# Patient Record
Sex: Female | Born: 1980 | Race: Black or African American | Hispanic: No | Marital: Single | Smoking: Current every day smoker
Health system: Southern US, Community
[De-identification: ages and names within clinical notes are randomized; demographics above are authoritative.]

## PROBLEM LIST (undated history)

## (undated) DIAGNOSIS — Z972 Presence of dental prosthetic device (complete) (partial): Secondary | ICD-10-CM

## (undated) DIAGNOSIS — D649 Anemia, unspecified: Secondary | ICD-10-CM

## (undated) DIAGNOSIS — I10 Essential (primary) hypertension: Secondary | ICD-10-CM

## (undated) DIAGNOSIS — A6000 Herpesviral infection of urogenital system, unspecified: Secondary | ICD-10-CM

## (undated) DIAGNOSIS — R87619 Unspecified abnormal cytological findings in specimens from cervix uteri: Secondary | ICD-10-CM

## (undated) DIAGNOSIS — Z973 Presence of spectacles and contact lenses: Secondary | ICD-10-CM

## (undated) DIAGNOSIS — M5030 Other cervical disc degeneration, unspecified cervical region: Secondary | ICD-10-CM

## (undated) DIAGNOSIS — N9985 Post endometrial ablation syndrome: Secondary | ICD-10-CM

## (undated) DIAGNOSIS — F411 Generalized anxiety disorder: Secondary | ICD-10-CM

## (undated) DIAGNOSIS — M4722 Other spondylosis with radiculopathy, cervical region: Secondary | ICD-10-CM

## (undated) DIAGNOSIS — E049 Nontoxic goiter, unspecified: Secondary | ICD-10-CM

## (undated) DIAGNOSIS — Z8616 Personal history of COVID-19: Secondary | ICD-10-CM

## (undated) DIAGNOSIS — F1911 Other psychoactive substance abuse, in remission: Secondary | ICD-10-CM

## (undated) DIAGNOSIS — M4727 Other spondylosis with radiculopathy, lumbosacral region: Secondary | ICD-10-CM

## (undated) HISTORY — PX: WISDOM TOOTH EXTRACTION: SHX21

## (undated) HISTORY — PX: FOOT SURGERY: SHX648

---

## 1998-03-06 ENCOUNTER — Inpatient Hospital Stay (HOSPITAL_COMMUNITY): Admission: AD | Admit: 1998-03-06 | Discharge: 1998-03-06 | Payer: Self-pay | Admitting: Obstetrics & Gynecology

## 1998-03-06 ENCOUNTER — Emergency Department (HOSPITAL_COMMUNITY): Admission: EM | Admit: 1998-03-06 | Discharge: 1998-03-06 | Payer: Self-pay | Admitting: Emergency Medicine

## 1998-03-06 ENCOUNTER — Encounter: Payer: Self-pay | Admitting: Emergency Medicine

## 1998-03-07 ENCOUNTER — Inpatient Hospital Stay (HOSPITAL_COMMUNITY): Admission: AD | Admit: 1998-03-07 | Discharge: 1998-03-07 | Payer: Self-pay | Admitting: *Deleted

## 1998-10-07 ENCOUNTER — Inpatient Hospital Stay (HOSPITAL_COMMUNITY): Admission: AD | Admit: 1998-10-07 | Discharge: 1998-10-07 | Payer: Self-pay | Admitting: *Deleted

## 1998-10-08 ENCOUNTER — Inpatient Hospital Stay (HOSPITAL_COMMUNITY): Admission: AD | Admit: 1998-10-08 | Discharge: 1998-10-08 | Payer: Self-pay | Admitting: Obstetrics

## 1999-06-05 ENCOUNTER — Emergency Department (HOSPITAL_COMMUNITY): Admission: EM | Admit: 1999-06-05 | Discharge: 1999-06-05 | Payer: Self-pay | Admitting: Emergency Medicine

## 1999-09-16 ENCOUNTER — Inpatient Hospital Stay (HOSPITAL_COMMUNITY): Admission: EM | Admit: 1999-09-16 | Discharge: 1999-09-16 | Payer: Self-pay | Admitting: Obstetrics

## 1999-10-06 ENCOUNTER — Emergency Department (HOSPITAL_COMMUNITY): Admission: EM | Admit: 1999-10-06 | Discharge: 1999-10-06 | Payer: Self-pay | Admitting: Emergency Medicine

## 1999-10-06 ENCOUNTER — Encounter: Payer: Self-pay | Admitting: Emergency Medicine

## 1999-11-30 ENCOUNTER — Emergency Department (HOSPITAL_COMMUNITY): Admission: EM | Admit: 1999-11-30 | Discharge: 1999-11-30 | Payer: Self-pay | Admitting: Emergency Medicine

## 1999-11-30 ENCOUNTER — Encounter: Payer: Self-pay | Admitting: Emergency Medicine

## 2000-02-12 ENCOUNTER — Emergency Department (HOSPITAL_COMMUNITY): Admission: EM | Admit: 2000-02-12 | Discharge: 2000-02-12 | Payer: Self-pay | Admitting: Emergency Medicine

## 2000-10-12 ENCOUNTER — Encounter: Payer: Self-pay | Admitting: Emergency Medicine

## 2000-10-12 ENCOUNTER — Emergency Department (HOSPITAL_COMMUNITY): Admission: EM | Admit: 2000-10-12 | Discharge: 2000-10-13 | Payer: Self-pay | Admitting: Internal Medicine

## 2001-11-09 ENCOUNTER — Emergency Department (HOSPITAL_COMMUNITY): Admission: EM | Admit: 2001-11-09 | Discharge: 2001-11-09 | Payer: Self-pay | Admitting: Emergency Medicine

## 2003-07-22 ENCOUNTER — Emergency Department (HOSPITAL_COMMUNITY): Admission: AD | Admit: 2003-07-22 | Discharge: 2003-07-22 | Payer: Self-pay | Admitting: Family Medicine

## 2003-08-10 ENCOUNTER — Emergency Department (HOSPITAL_COMMUNITY): Admission: AD | Admit: 2003-08-10 | Discharge: 2003-08-10 | Payer: Self-pay | Admitting: Family Medicine

## 2004-01-17 ENCOUNTER — Emergency Department (HOSPITAL_COMMUNITY): Admission: EM | Admit: 2004-01-17 | Discharge: 2004-01-17 | Payer: Self-pay | Admitting: Family Medicine

## 2004-02-28 ENCOUNTER — Emergency Department (HOSPITAL_COMMUNITY): Admission: EM | Admit: 2004-02-28 | Discharge: 2004-02-28 | Payer: Self-pay | Admitting: Emergency Medicine

## 2004-04-06 ENCOUNTER — Emergency Department (HOSPITAL_COMMUNITY): Admission: EM | Admit: 2004-04-06 | Discharge: 2004-04-06 | Payer: Self-pay | Admitting: Emergency Medicine

## 2004-07-24 ENCOUNTER — Emergency Department (HOSPITAL_COMMUNITY): Admission: EM | Admit: 2004-07-24 | Discharge: 2004-07-24 | Payer: Self-pay | Admitting: Family Medicine

## 2004-11-12 ENCOUNTER — Emergency Department (HOSPITAL_COMMUNITY): Admission: EM | Admit: 2004-11-12 | Discharge: 2004-11-13 | Payer: Self-pay | Admitting: Emergency Medicine

## 2004-11-15 ENCOUNTER — Emergency Department (HOSPITAL_COMMUNITY): Admission: EM | Admit: 2004-11-15 | Discharge: 2004-11-15 | Payer: Self-pay | Admitting: *Deleted

## 2004-11-19 ENCOUNTER — Emergency Department (HOSPITAL_COMMUNITY): Admission: EM | Admit: 2004-11-19 | Discharge: 2004-11-19 | Payer: Self-pay | Admitting: Family Medicine

## 2005-03-27 ENCOUNTER — Emergency Department (HOSPITAL_COMMUNITY): Admission: EM | Admit: 2005-03-27 | Discharge: 2005-03-27 | Payer: Self-pay | Admitting: Emergency Medicine

## 2005-07-06 ENCOUNTER — Emergency Department (HOSPITAL_COMMUNITY): Admission: EM | Admit: 2005-07-06 | Discharge: 2005-07-06 | Payer: Self-pay | Admitting: Emergency Medicine

## 2006-04-20 ENCOUNTER — Emergency Department (HOSPITAL_COMMUNITY): Admission: EM | Admit: 2006-04-20 | Discharge: 2006-04-20 | Payer: Self-pay | Admitting: Emergency Medicine

## 2006-05-07 ENCOUNTER — Emergency Department (HOSPITAL_COMMUNITY): Admission: EM | Admit: 2006-05-07 | Discharge: 2006-05-07 | Payer: Self-pay | Admitting: Emergency Medicine

## 2006-10-21 ENCOUNTER — Emergency Department (HOSPITAL_COMMUNITY): Admission: EM | Admit: 2006-10-21 | Discharge: 2006-10-22 | Payer: Self-pay | Admitting: Emergency Medicine

## 2006-10-23 ENCOUNTER — Emergency Department (HOSPITAL_COMMUNITY): Admission: EM | Admit: 2006-10-23 | Discharge: 2006-10-23 | Payer: Self-pay | Admitting: Family Medicine

## 2007-09-15 ENCOUNTER — Inpatient Hospital Stay (HOSPITAL_COMMUNITY): Admission: AD | Admit: 2007-09-15 | Discharge: 2007-09-15 | Payer: Self-pay | Admitting: Obstetrics and Gynecology

## 2007-09-21 ENCOUNTER — Encounter: Admission: RE | Admit: 2007-09-21 | Discharge: 2007-09-21 | Payer: Self-pay | Admitting: Obstetrics and Gynecology

## 2007-10-12 ENCOUNTER — Inpatient Hospital Stay (HOSPITAL_COMMUNITY): Admission: AD | Admit: 2007-10-12 | Discharge: 2007-10-12 | Payer: Self-pay | Admitting: Obstetrics & Gynecology

## 2007-11-11 ENCOUNTER — Emergency Department (HOSPITAL_COMMUNITY): Admission: EM | Admit: 2007-11-11 | Discharge: 2007-11-11 | Payer: Self-pay | Admitting: Family Medicine

## 2008-01-26 ENCOUNTER — Ambulatory Visit (HOSPITAL_COMMUNITY): Admission: RE | Admit: 2008-01-26 | Discharge: 2008-01-26 | Payer: Self-pay | Admitting: Family Medicine

## 2008-04-08 ENCOUNTER — Ambulatory Visit: Payer: Self-pay | Admitting: Obstetrics and Gynecology

## 2008-04-08 ENCOUNTER — Inpatient Hospital Stay (HOSPITAL_COMMUNITY): Admission: AD | Admit: 2008-04-08 | Discharge: 2008-04-11 | Payer: Self-pay | Admitting: Obstetrics & Gynecology

## 2008-04-09 ENCOUNTER — Encounter (HOSPITAL_COMMUNITY): Payer: Self-pay | Admitting: Obstetrics and Gynecology

## 2008-06-20 ENCOUNTER — Emergency Department (HOSPITAL_COMMUNITY): Admission: EM | Admit: 2008-06-20 | Discharge: 2008-06-20 | Payer: Self-pay | Admitting: Family Medicine

## 2009-04-27 ENCOUNTER — Ambulatory Visit: Payer: Self-pay | Admitting: Obstetrics and Gynecology

## 2009-04-27 ENCOUNTER — Inpatient Hospital Stay (HOSPITAL_COMMUNITY): Admission: AD | Admit: 2009-04-27 | Discharge: 2009-04-27 | Payer: Self-pay | Admitting: Obstetrics and Gynecology

## 2009-08-17 ENCOUNTER — Ambulatory Visit: Payer: Self-pay | Admitting: Advanced Practice Midwife

## 2010-04-09 ENCOUNTER — Inpatient Hospital Stay (HOSPITAL_COMMUNITY): Admission: AD | Admit: 2010-04-09 | Discharge: 2009-08-19 | Payer: Self-pay | Admitting: Obstetrics & Gynecology

## 2010-07-21 LAB — RAPID URINE DRUG SCREEN, HOSP PERFORMED: Tetrahydrocannabinol: NOT DETECTED

## 2010-07-21 LAB — CBC
HCT: 34.3 % — ABNORMAL LOW (ref 36.0–46.0)
MCHC: 33.8 g/dL (ref 30.0–36.0)
MCV: 96.7 fL (ref 78.0–100.0)
Platelets: 257 10*3/uL (ref 150–400)
RDW: 12.8 % (ref 11.5–15.5)

## 2010-08-03 LAB — WET PREP, GENITAL
Trich, Wet Prep: NONE SEEN
Yeast Wet Prep HPF POC: NONE SEEN

## 2010-08-03 LAB — DIFFERENTIAL
Basophils Absolute: 0 10*3/uL (ref 0.0–0.1)
Basophils Relative: 0 % (ref 0–1)
Eosinophils Relative: 1 % (ref 0–5)
Monocytes Absolute: 0.8 10*3/uL (ref 0.1–1.0)
Neutro Abs: 6.2 10*3/uL (ref 1.7–7.7)

## 2010-08-03 LAB — URINALYSIS, ROUTINE W REFLEX MICROSCOPIC
Bilirubin Urine: NEGATIVE
Nitrite: NEGATIVE
Specific Gravity, Urine: 1.015 (ref 1.005–1.030)
Urobilinogen, UA: 0.2 mg/dL (ref 0.0–1.0)

## 2010-08-03 LAB — COMPREHENSIVE METABOLIC PANEL
Alkaline Phosphatase: 63 U/L (ref 39–117)
BUN: 5 mg/dL — ABNORMAL LOW (ref 6–23)
CO2: 26 mEq/L (ref 19–32)
Chloride: 103 mEq/L (ref 96–112)
Glucose, Bld: 82 mg/dL (ref 70–99)
Potassium: 3.5 mEq/L (ref 3.5–5.1)
Total Bilirubin: 0.3 mg/dL (ref 0.3–1.2)

## 2010-08-03 LAB — FETAL FIBRONECTIN: Fetal Fibronectin: POSITIVE — AB

## 2010-08-03 LAB — CBC
HCT: 33.6 % — ABNORMAL LOW (ref 36.0–46.0)
Hemoglobin: 11.5 g/dL — ABNORMAL LOW (ref 12.0–15.0)
MCHC: 34.2 g/dL (ref 30.0–36.0)
RDW: 12.7 % (ref 11.5–15.5)

## 2010-08-03 LAB — RAPID URINE DRUG SCREEN, HOSP PERFORMED: Cocaine: POSITIVE — AB

## 2011-01-27 LAB — HCG, QUANTITATIVE, PREGNANCY

## 2011-01-27 LAB — GC/CHLAMYDIA PROBE AMP, GENITAL
Chlamydia, DNA Probe: NEGATIVE
GC Probe Amp, Genital: NEGATIVE

## 2011-01-28 LAB — URINALYSIS, ROUTINE W REFLEX MICROSCOPIC
Glucose, UA: NEGATIVE
Leukocytes, UA: NEGATIVE
Nitrite: POSITIVE — AB
Protein, ur: NEGATIVE
pH: 6

## 2011-01-28 LAB — URINE MICROSCOPIC-ADD ON

## 2011-01-28 LAB — WET PREP, GENITAL

## 2011-02-04 LAB — CBC
HCT: 34.1 % — ABNORMAL LOW (ref 36.0–46.0)
Hemoglobin: 11.6 g/dL — ABNORMAL LOW (ref 12.0–15.0)
MCHC: 34.1 g/dL (ref 30.0–36.0)
Platelets: 239 10*3/uL (ref 150–400)
RDW: 13.8 % (ref 11.5–15.5)

## 2011-02-04 LAB — RAPID URINE DRUG SCREEN, HOSP PERFORMED
Amphetamines: NOT DETECTED
Opiates: NOT DETECTED
Tetrahydrocannabinol: NOT DETECTED

## 2011-02-17 LAB — CULTURE, ROUTINE-ABSCESS

## 2011-09-22 ENCOUNTER — Emergency Department: Payer: Self-pay | Admitting: Emergency Medicine

## 2013-04-10 ENCOUNTER — Emergency Department (INDEPENDENT_AMBULATORY_CARE_PROVIDER_SITE_OTHER)
Admission: EM | Admit: 2013-04-10 | Discharge: 2013-04-10 | Disposition: A | Discharge status: Home / Self Care | Payer: BC Managed Care – PPO | Source: Home / Self Care

## 2013-04-10 ENCOUNTER — Encounter (HOSPITAL_COMMUNITY): Payer: Self-pay | Admitting: Emergency Medicine

## 2013-04-10 DIAGNOSIS — J329 Chronic sinusitis, unspecified: Secondary | ICD-10-CM

## 2013-04-10 MED ORDER — AMOXICILLIN-POT CLAVULANATE 875-125 MG PO TABS: 1.0000 | ORAL_TABLET | Freq: Two times a day (BID) | ORAL | Status: DC

## 2013-04-10 NOTE — ED Notes (Signed)
Pt  Reports  Some sinus pressure with  Pain  Around  Eyes       -  She  Has  Some swelling   To r  Periorbital  Area            -  She       Reports  Symptoms  Not releived  By  OTC  meds             She  Reports   The    Light  hurts  Her  Eyes     And  She  Has  Pain  When  She looks  Down

## 2013-04-10 NOTE — ED Provider Notes (Signed)
Alison Hamilton is a 32 y.o. female who presents to Urgent Care today for right facial pain and pressure. This is been present for one day. This is associated with nasal congestion. She denies any nausea vomiting diarrhea fevers or chills. She denies any trouble breathing. She additionally has some left-sided neck pain. She denies any injury, or radiating pain weakness or numbness.   History reviewed. No pertinent past medical history. History  Substance Use Topics  . Smoking status: Current Every Day Smoker  . Smokeless tobacco: Not on file  . Alcohol Use: No   ROS as above Medications reviewed. No current facility-administered medications for this encounter.   Current Outpatient Prescriptions  Medication Sig Dispense Refill  . amoxicillin-clavulanate (AUGMENTIN) 875-125 MG per tablet Take 1 tablet by mouth every 12 (twelve) hours.  14 tablet  0    Exam:  BP 115/72  Pulse 83  Temp(Src) 98.2 F (36.8 C) (Oral)  Resp 16  SpO2 100% Gen: Well NAD HEENT: EOMI,  MMM, tympanic membranes are normal appearing bilaterally. Posterior pharynx is mildly erythematous. Right maxillary sinuse tender to palpation. Lungs: Normal work of breathing. CTABL Heart: RRR no MRG Abd: NABS, Soft. NT, ND Neck: Nontender to spinal midline normal neck range of motion   Assessment and Plan: 32 y.o. female with sinusitis. Likely bacterial. Plan to treat with Augmentin. Additionally use over-the-counter NSAIDs as needed for pain. Work note provided. Discussed warning signs or symptoms. Please see discharge instructions. Patient expresses understanding.      Rodolph Bong, MD 04/10/13 1501

## 2013-09-10 ENCOUNTER — Ambulatory Visit: Payer: BC Managed Care – PPO | Admitting: Family Medicine

## 2013-09-10 DIAGNOSIS — Z0289 Encounter for other administrative examinations: Secondary | ICD-10-CM

## 2014-05-28 ENCOUNTER — Encounter (HOSPITAL_COMMUNITY): Payer: Self-pay | Admitting: Emergency Medicine

## 2014-05-28 ENCOUNTER — Emergency Department (HOSPITAL_COMMUNITY)
Admission: EM | Admit: 2014-05-28 | Discharge: 2014-05-28 | Disposition: A | Discharge status: Home / Self Care | Payer: Self-pay | Attending: Emergency Medicine | Admitting: Emergency Medicine

## 2014-05-28 DIAGNOSIS — L02412 Cutaneous abscess of left axilla: Secondary | ICD-10-CM | POA: Insufficient documentation

## 2014-05-28 DIAGNOSIS — Z792 Long term (current) use of antibiotics: Secondary | ICD-10-CM | POA: Insufficient documentation

## 2014-05-28 DIAGNOSIS — Z720 Tobacco use: Secondary | ICD-10-CM | POA: Insufficient documentation

## 2014-05-28 MED ORDER — LIDOCAINE-EPINEPHRINE (PF) 2 %-1:200000 IJ SOLN
10.0000 mL | Freq: Once | INTRAMUSCULAR | Status: AC
  Administered 2014-05-28: 10 mL
  Filled 2014-05-28: qty 20

## 2014-05-28 NOTE — ED Notes (Signed)
MD at bedside to drain abscess.

## 2014-05-28 NOTE — Discharge Instructions (Signed)
Abscess °An abscess (boil or furuncle) is an infected area on or under the skin. This area is filled with yellowish-white fluid (pus) and other material (debris). °HOME CARE  °· Only take medicines as told by your doctor. °· If you were given antibiotic medicine, take it as directed. Finish the medicine even if you start to feel better. °· If gauze is used, follow your doctor's directions for changing the gauze. °· To avoid spreading the infection: °· Keep your abscess covered with a bandage. °· Wash your hands well. °· Do not share personal care items, towels, or whirlpools with others. °· Avoid skin contact with others. °· Keep your skin and clothes clean around the abscess. °· Keep all doctor visits as told. °GET HELP RIGHT AWAY IF:  °· You have more pain, puffiness (swelling), or redness in the wound site. °· You have more fluid or blood coming from the wound site. °· You have muscle aches, chills, or you feel sick. °· You have a fever. °MAKE SURE YOU:  °· Understand these instructions. °· Will watch your condition. °· Will get help right away if you are not doing well or get worse. °Document Released: 10/06/2007 Document Revised: 10/19/2011 Document Reviewed: 07/02/2011 °Exit[employer]® Patient Information ©2015 [employer]. This information is not intended to replace advice given to you by your health care provider. Make sure you discuss any questions you have with your health care provider. ° °Abscess °Care After °An abscess (also called a boil or furuncle) is an infected area that contains a collection of pus. Signs and symptoms of an abscess include pain, tenderness, redness, or hardness, or you may feel a moveable soft area under your skin. An abscess can occur anywhere in the body. The infection may spread to surrounding tissues causing cellulitis. A cut (incision) by the surgeon was made over your abscess and the pus was drained out. Gauze may have been packed into the space to provide a drain that will  allow the cavity to heal from the inside outwards. The boil may be painful for 5 to 7 days. Most people with a boil do not have high fevers. Your abscess, if seen early, may not have localized, and may not have been lanced. If not, another appointment may be required for this if it does not get better on its own or with medications. °HOME CARE INSTRUCTIONS  °· Only take over-the-counter or prescription medicines for pain, discomfort, or fever as directed by your caregiver. °· When you bathe, soak and then remove gauze or iodoform packs at least daily or as directed by your caregiver. You may then wash the wound gently with mild soapy water. Repack with gauze or do as your caregiver directs. °SEEK IMMEDIATE MEDICAL CARE IF:  °· You develop increased pain, swelling, redness, drainage, or bleeding in the wound site. °· You develop signs of generalized infection including muscle aches, chills, fever, or a general ill feeling. °· An oral temperature above 102° F (38.9° C) develops, not controlled by medication. °See your caregiver for a recheck if you develop any of the symptoms described above. If medications (antibiotics) were prescribed, take them as directed. °Document Released: 11/05/2004 Document Revised: 07/12/2011 Document Reviewed: 07/03/2007 °ExitCare® Patient Information ©2015 [employer]. This information is not intended to replace advice given to you by your health care provider. Make sure you discuss any questions you have with your health care provider. ° °

## 2014-05-28 NOTE — ED Provider Notes (Addendum)
CSN: [id number]     Arrival date & time 05/28/14  [id number] History   First MD Initiated Contact with Patient 05/28/14 [id number]     Chief Complaint  Patient presents with  . Abscess     (Consider location/radiation/quality/duration/timing/severity/associated sxs/prior Treatment) Patient is a 34 y.o. female presenting with abscess. The history is provided by the patient.  Abscess Location:  Shoulder/arm Shoulder/arm abscess location:  L axilla Size:  Golf ball sized Abscess quality: fluctuance, induration, painful and redness   Red streaking: no   Duration:  1 week Progression:  Worsening Pain details:    Quality:  Sharp, throbbing and shooting   Timing:  Constant   Progression:  Worsening Chronicity:  Recurrent Context: not diabetes, not immunosuppression and not injected drug use   Relieved by:  Nothing Worsened by:  Draining/squeezing Ineffective treatments:  Warm compresses Associated symptoms: no fever, no nausea and no vomiting   Risk factors: prior abscess     History reviewed. No pertinent past medical history. History reviewed. No pertinent past surgical history. History reviewed. No pertinent family history. History  Substance Use Topics  . Smoking status: Current Every Day Smoker  . Smokeless tobacco: Not on file  . Alcohol Use: No   OB History    No data available     Review of Systems  Constitutional: Negative for fever.  Gastrointestinal: Negative for nausea and vomiting.  All other systems reviewed and are negative.     Allergies  Review of patient's allergies indicates no known allergies.  Home Medications   Prior to Admission medications   Medication Sig Start Date End Date Taking? Authorizing Provider  amoxicillin-clavulanate (AUGMENTIN) 875-125 MG per tablet Take 1 tablet by mouth every 12 (twelve) hours. 04/10/13   Rodolph BongEvan S Corey, MD   BP 114/72 mmHg  Pulse 91  Temp(Src) 98.5 F (36.9 C) (Oral)  Resp 20  Ht 5\' 6"  (1.676 m)  Wt 156 lb (70.761  kg)  BMI 25.19 kg/m2  SpO2 98% Physical Exam  Constitutional: She is oriented to person, place, and time. She appears well-developed and well-nourished. She appears distressed.  HENT:  Head: Normocephalic and atraumatic.  Eyes: EOM are normal. Pupils are equal, round, and reactive to light.  Cardiovascular: Normal rate.   Pulmonary/Chest: Effort normal.  Musculoskeletal: Normal range of motion.  No edema  Neurological: She is alert and oriented to person, place, and time.  Skin: Skin is warm and dry. No rash noted.     Psychiatric: She has a normal mood and affect. Her behavior is normal.  Nursing note and vitals reviewed.   ED Course  Procedures (including critical care time) Labs Review Labs Reviewed - No data to display  Imaging Review No results found.   EKG Interpretation None     INCISION AND DRAINAGE Performed by: Gwyneth SproutPLUNKETT,Sundeep Cary Consent: Verbal consent obtained. Risks and benefits: risks, benefits and alternatives were discussed Type: abscess  Body area: left axilla  Anesthesia: local infiltration  Incision was made with a scalpel.  Local anesthetic: lidocaine 2% with epinephrine  Anesthetic total: 1 ml  Complexity: complex Blunt dissection to break up loculations  Drainage: purulent  Drainage amount: 5mL  Packing material: 1/4 in iodoform gauze  Patient tolerance: Patient tolerated the procedure well with no immediate complications.    MDM   Final diagnoses:  Abscess of left axilla    Patient with abscess in her left axilla. It is fluctuant, pointing and indurated. No drainage at this time. No surrounding erythema.  No systemic symptoms.    Gwyneth Sprout, MD 05/28/14 4098  Gwyneth Sprout, MD 05/28/14 [phone]

## 2014-05-28 NOTE — ED Notes (Signed)
Pt reports to ED with abscess. Pt reports it has been there 1 week, reports having chills. Pt in significant amount of pain. Abscess under left armpit. Pt denies medical hx.

## 2014-06-11 ENCOUNTER — Emergency Department (HOSPITAL_COMMUNITY)
Admission: EM | Admit: 2014-06-11 | Discharge: 2014-06-11 | Disposition: A | Discharge status: Home / Self Care | Payer: Self-pay | Attending: Emergency Medicine | Admitting: Emergency Medicine

## 2014-06-11 ENCOUNTER — Emergency Department (HOSPITAL_COMMUNITY): Payer: Self-pay

## 2014-06-11 ENCOUNTER — Encounter (HOSPITAL_COMMUNITY): Payer: Self-pay

## 2014-06-11 DIAGNOSIS — Z720 Tobacco use: Secondary | ICD-10-CM | POA: Insufficient documentation

## 2014-06-11 DIAGNOSIS — M25511 Pain in right shoulder: Secondary | ICD-10-CM | POA: Insufficient documentation

## 2014-06-11 DIAGNOSIS — R0789 Other chest pain: Secondary | ICD-10-CM | POA: Insufficient documentation

## 2014-06-11 DIAGNOSIS — M25519 Pain in unspecified shoulder: Secondary | ICD-10-CM

## 2014-06-11 LAB — BASIC METABOLIC PANEL
ANION GAP: 5 (ref 5–15)
BUN: 9 mg/dL (ref 6–23)
CHLORIDE: 108 mmol/L (ref 96–112)
CO2: 24 mmol/L (ref 19–32)
Calcium: 9.2 mg/dL (ref 8.4–10.5)
Creatinine, Ser: 0.87 mg/dL (ref 0.50–1.10)
GFR, EST NON AFRICAN AMERICAN: 85 mL/min — AB (ref >90)
Glucose, Bld: 98 mg/dL (ref 70–99)
POTASSIUM: 3.7 mmol/L (ref 3.5–5.1)
SODIUM: 137 mmol/L (ref 135–145)

## 2014-06-11 LAB — HEPATIC FUNCTION PANEL
ALK PHOS: 61 U/L (ref 39–117)
ALT: 12 U/L (ref 0–35)
AST: 15 U/L (ref 0–37)
Albumin: 4.1 g/dL (ref 3.5–5.2)
BILIRUBIN DIRECT: 0.2 mg/dL (ref 0.0–0.5)
BILIRUBIN TOTAL: 0.7 mg/dL (ref 0.3–1.2)
Indirect Bilirubin: 0.5 mg/dL (ref 0.3–0.9)
Total Protein: 7.1 g/dL (ref 6.0–8.3)

## 2014-06-11 LAB — I-STAT TROPONIN, ED: Troponin i, poc: 0 ng/mL (ref 0.00–0.08)

## 2014-06-11 LAB — CBC
HCT: 40.4 % (ref 36.0–46.0)
Hemoglobin: 14 g/dL (ref 12.0–15.0)
MCH: 31.1 pg (ref 26.0–34.0)
MCHC: 34.7 g/dL (ref 30.0–36.0)
MCV: 89.8 fL (ref 78.0–100.0)
PLATELETS: 237 10*3/uL (ref 150–400)
RBC: 4.5 MIL/uL (ref 3.87–5.11)
RDW: 12.7 % (ref 11.5–15.5)
WBC: 7 10*3/uL (ref 4.0–10.5)

## 2014-06-11 MED ORDER — MORPHINE SULFATE 4 MG/ML IJ SOLN
4.0000 mg | Freq: Once | INTRAMUSCULAR | Status: AC
  Administered 2014-06-11: 4 mg via INTRAVENOUS
  Filled 2014-06-11: qty 1

## 2014-06-11 MED ORDER — ASPIRIN 81 MG PO CHEW
324.0000 mg | CHEWABLE_TABLET | Freq: Once | ORAL | Status: AC
  Administered 2014-06-11: 324 mg via ORAL
  Filled 2014-06-11: qty 4

## 2014-06-11 NOTE — ED Notes (Signed)
Lab is currently running CBC and CMET. Dr. Gardiner RhymeZavits aware.

## 2014-06-11 NOTE — Discharge Instructions (Signed)
If you were given medicines take as directed.  If you are on coumadin or contraceptives realize their levels and effectiveness is altered by many different medicines.  If you have any reaction (rash, tongues swelling, other) to the medicines stop taking and see a physician.   Please follow up as directed and return to the ER or see a physician for new or worsening symptoms.  Thank you. Filed Vitals:   06/11/14 0853 06/11/14 1052  BP: 119/83 106/80  Pulse: 70   Temp: 98.5 F (36.9 C)   TempSrc: Oral   Resp: 18 18  SpO2: 99% 98%

## 2014-06-11 NOTE — ED Notes (Signed)
Patient presents today with a chief complaint of bilateral arm pain that began 2 months ago in right arm and 2 weeks ago in left. Patient also reports intermittent chest pain and dizziness over past several days. Denies recent injury to extremities, no heavy lifting.

## 2014-06-11 NOTE — ED Provider Notes (Signed)
CSN: [id number]     Arrival date & time 06/11/14  4098 History   First MD Initiated Contact with Patient 06/11/14 [phone]     Chief Complaint  Patient presents with  . Arm Pain     (Consider location/radiation/quality/duration/timing/severity/associated sxs/prior Treatment) HPI Comments: 34 year old female smoker, family history of cardiac presents with intermittent gradually worsening shoulder pain for the past 2 months. Initially worse in the right shoulder worse with position lifting or arm overhead and then she has had development of milder but similar left shoulder pain. No weakness in the arms or legs, no neck surgery history. Patient has had very brief 2 minutes sharp chest pain intermittent. No chest pain currently. Patient is recurrent chest pain episodes almost daily. No exertional diaphoretic symptoms. No known cardiac history with the patient.Patient denies blood clot history, active cancer, recent major trauma or surgery, unilateral leg swelling/ pain, recent long travel, hemoptysis or oral contraceptives.Non radiating. No shoulder pathology known.  Worse with lifting arms above head.    Patient is a 34 y.o. female presenting with arm pain. The history is provided by the patient.  Arm Pain Associated symptoms include chest pain. Pertinent negatives include no abdominal pain, no headaches and no shortness of breath.    History reviewed. No pertinent past medical history. History reviewed. No pertinent past surgical history. No family history on file. History  Substance Use Topics  . Smoking status: Current Every Day Smoker  . Smokeless tobacco: Not on file  . Alcohol Use: No   OB History    No data available     Review of Systems  Constitutional: Negative for fever and chills.  HENT: Negative for congestion.   Eyes: Negative for visual disturbance.  Respiratory: Negative for shortness of breath.   Cardiovascular: Positive for chest pain. Negative for leg swelling.   Gastrointestinal: Negative for vomiting and abdominal pain.  Genitourinary: Negative for dysuria and flank pain.  Musculoskeletal: Positive for arthralgias. Negative for back pain, neck pain and neck stiffness.  Skin: Negative for rash.  Neurological: Negative for weakness, light-headedness, numbness and headaches.      Allergies  Review of patient's allergies indicates no known allergies.  Home Medications   Prior to Admission medications   Medication Sig Start Date End Date Taking? Authorizing Provider  amoxicillin-clavulanate (AUGMENTIN) 875-125 MG per tablet Take 1 tablet by mouth every 12 (twelve) hours. Patient not taking: Reported on 06/11/2014 04/10/13   Rodolph Bong, MD   BP 114/72 mmHg  Pulse 77  Temp(Src) 98.3 F (36.8 C) (Oral)  Resp 19  SpO2 100% Physical Exam  Constitutional: She is oriented to person, place, and time. She appears well-developed and well-nourished.  HENT:  Head: Normocephalic and atraumatic.  Eyes: Right eye exhibits no discharge. Left eye exhibits no discharge.  Neck: Normal range of motion. Neck supple. No tracheal deviation present.  Cardiovascular: Normal rate, regular rhythm and intact distal pulses.   Pulmonary/Chest: Effort normal and breath sounds normal.  Abdominal: Soft. She exhibits no distension. There is no tenderness. There is no guarding.  Musculoskeletal: She exhibits tenderness. She exhibits no edema.  Patient has 5+ strength with shoulder abduction, arm flexion, wrist extension, finger flexion, sensation intact and equal bilateral upper lower extremities to palpation in major nerve distributions. Equal bilateral reflexes 2+. Patient has tenderness with flexion and empty can test bilateral shoulders worse on the right.  Neurological: She is alert and oriented to person, place, and time.  Skin: Skin is warm. No  rash noted.  Psychiatric: She has a normal mood and affect.  Nursing note and vitals reviewed.   ED Course  Procedures  (including critical care time) Labs Review Labs Reviewed  BASIC METABOLIC PANEL - Abnormal; Notable for the following:    GFR calc non Af Amer 85 (*)    All other components within normal limits  CBC  HEPATIC FUNCTION PANEL  I-STAT TROPOININ, ED    Imaging Review Dg Chest 2 View  06/11/2014   CLINICAL DATA:  Arm pain  EXAM: CHEST  2 VIEW  COMPARISON:  None.  FINDINGS: The heart size and mediastinal contours are within normal limits. Both lungs are clear. The visualized skeletal structures are unremarkable.  IMPRESSION: No active cardiopulmonary disease.   Electronically Signed   By: Charlett NoseKevin  Dover M.D.   On: 06/11/2014 09:43     EKG Interpretation   Date/Time:  Tuesday June 11 2014 09:00:12 EST Ventricular Rate:  67 PR Interval:  168 QRS Duration: 77 QT Interval:  394 QTC Calculation: 416 R Axis:   86 Text Interpretation:  Sinus rhythm Abnormal V2 Confirmed by Kateri Balch  MD,  Ailanie Ruttan ([id number]) on 06/11/2014 9:34:37 AM      MDM   Final diagnoses:  Shoulder pain, unspecified laterality  Chest pain, atypical   Patient presents with clinically musculoskeletal/shoulder arthritis versus impingement versus rotator strain. With intermittent atypical brief chest pain and shoulder pain plan for cardiac screen. Patient has had these symptoms for a month and a half. No cp or sob in ED.  Discussed close outpatient follow up with primary Dr. for cardiac screen unremarkable. Pain medicines of aspirin given.  Pt improved on recheck. Xray unremarkable reviewed. Patient symptoms atypical, patient low risk cardiac. Outpatient follow-up.  Results and differential diagnosis were discussed with the patient/parent/guardian. Close follow up outpatient was discussed, comfortable with the plan.   Medications  morphine 4 MG/ML injection 4 mg (4 mg Intravenous Given 06/11/14 1005)  aspirin chewable tablet 324 mg (324 mg Oral Given 06/11/14 1003)    Filed Vitals:   06/11/14 0853 06/11/14 1052 06/11/14  1130  BP: 119/83 106/80 114/72  Pulse: 70  77  Temp: 98.5 F (36.9 C)  98.3 F (36.8 C)  TempSrc: Oral  Oral  Resp: 18 18 19   SpO2: 99% 98% 100%    Final diagnoses:  Shoulder pain, unspecified laterality  Chest pain, atypical       Enid SkeensJoshua M Nancyjo Givhan, MD 06/11/14 1149

## 2016-02-09 ENCOUNTER — Encounter (HOSPITAL_COMMUNITY): Payer: Self-pay

## 2016-02-09 ENCOUNTER — Emergency Department (HOSPITAL_COMMUNITY)
Admission: EM | Admit: 2016-02-09 | Discharge: 2016-02-09 | Disposition: A | Discharge status: Home / Self Care | Payer: 59 | Attending: Physician Assistant | Admitting: Physician Assistant

## 2016-02-09 DIAGNOSIS — F17200 Nicotine dependence, unspecified, uncomplicated: Secondary | ICD-10-CM | POA: Insufficient documentation

## 2016-02-09 DIAGNOSIS — J111 Influenza due to unidentified influenza virus with other respiratory manifestations: Secondary | ICD-10-CM

## 2016-02-09 DIAGNOSIS — R69 Illness, unspecified: Secondary | ICD-10-CM

## 2016-02-09 DIAGNOSIS — R05 Cough: Secondary | ICD-10-CM | POA: Insufficient documentation

## 2016-02-09 DIAGNOSIS — J029 Acute pharyngitis, unspecified: Secondary | ICD-10-CM | POA: Diagnosis present

## 2016-02-09 MED ORDER — IBUPROFEN 600 MG PO TABS: 600.0000 mg | ORAL_TABLET | Freq: Four times a day (QID) | ORAL | 0 refills | Status: DC | PRN

## 2016-02-09 MED ORDER — BENZONATATE 100 MG PO CAPS: 100.0000 mg | ORAL_CAPSULE | Freq: Three times a day (TID) | ORAL | 0 refills | Status: DC | PRN

## 2016-02-09 NOTE — ED Provider Notes (Signed)
[facility]-EMERGENCY DEPT Provider Note   CSN: [id number] Arrival date & time: 02/09/16  1205 By signing my name below, I, Alison Hamilton, attest that this documentation has been prepared under the direction and in the presence of [facility] CenterJaime Anjolie Majer, PA-C. Electronically Signed: Bridgette HabermannMaria Hamilton, ED Scribe. 02/09/16. 12:43 PM.  History   Chief Complaint Chief Complaint  Patient presents with  . Sore Throat  . Generalized Body Aches  . Fever   HPI Comments: Alison Hamilton is a 35 y.o. female who presents to the Emergency Department complaining of gradually worsening, 8/10 sore throat onset one day ago. Pt also has associated subjective fever, productive cough, and generalized body aches. She has taken Theraflu with moderate relief. No known sick contacts with similar symptoms. She denies vomiting, abdominal pain, back pain or any other associated symptoms.   The history is provided by the patient. No language interpreter was used.    History reviewed. No pertinent past medical history.  There are no active problems to display for this patient.   History reviewed. No pertinent surgical history.  OB History    No data available       Home Medications    Prior to Admission medications   Medication Sig Start Date End Date Taking? Authorizing Provider  amoxicillin-clavulanate (AUGMENTIN) 875-125 MG per tablet Take 1 tablet by mouth every 12 (twelve) hours. Patient not taking: Reported on 06/11/2014 04/10/13   Rodolph BongEvan S Corey, MD    Family History No family history on file.  Social History Social History  Substance Use Topics  . Smoking status: Current Every Day Smoker  . Smokeless tobacco: Never Used  . Alcohol use No     Allergies   Review of patient's allergies indicates no known allergies.   Review of Systems Review of Systems  Constitutional: Positive for fever.  HENT: Positive for sore throat.   Respiratory: Positive for cough.   Gastrointestinal: Negative for abdominal pain.    Musculoskeletal: Positive for arthralgias and myalgias.     Physical Exam Updated Vital Signs BP 117/82 (BP Location: Right Arm)   Pulse 104   Temp 98.6 F (37 C) (Oral)   Resp 18   Ht 5\' 3"  (1.6 m)   Wt 68 kg   SpO2 99%   BMI 26.57 kg/m   Physical Exam  Constitutional: She is oriented to person, place, and time. She appears well-developed and well-nourished. No distress.  HENT:  Head: Normocephalic and atraumatic.  OP with erythema, no exudates or tonsillar hypertrophy. + nasal congestion with mucosal edema. No focal sinus tenderness.  Neck: Normal range of motion. Neck supple.  No meningeal signs.   Cardiovascular: Normal rate and regular rhythm.   Pulmonary/Chest: Effort normal.  Lungs are clear to auscultation bilaterally - no w/r/r  Abdominal: Soft. She exhibits no distension. There is no tenderness.  Musculoskeletal: Normal range of motion.  Neurological: She is alert and oriented to person, place, and time.  Skin: Skin is warm and dry. She is not diaphoretic.  Nursing note and vitals reviewed.    ED Treatments / Results  DIAGNOSTIC STUDIES: Oxygen Saturation is 99% on RA, normal by my interpretation.    COORDINATION OF CARE: 12:40 PM Discussed treatment plan with pt at bedside which includes symptomatic treatments and pt agreed to plan.  Labs (all labs ordered are listed, but only abnormal results are displayed) Labs Reviewed - No data to display  EKG  EKG Interpretation None       Radiology No  results found.  Procedures Procedures (including critical care time)  Medications Ordered in ED Medications - No data to display   Initial Impression / Assessment and Plan / ED Course  I have reviewed the triage vital signs and the nursing notes.  Pertinent labs & imaging results that were available during my care of the patient were reviewed by me and considered in my medical decision making (see chart for details).  Clinical Course   Alison Hamilton is a 35 y.o. female who presents to ED for URI symptoms c/w viral etiology. She is afebrile. No signs of dehydration, tolerating PO's. Lungs are clear.  Patient will be discharged with instructions to orally hydrate and rest. Rx for ibuprofen PRN body aches and Tessalon PRN cough. PCP follow-up if symptoms persist. Reasons to return to ED discussed and all questions answered.  Final Clinical Impressions(s) / ED Diagnoses   Final diagnoses:  None   I personally performed the services described in this documentation, which was scribed in my presence. The recorded information has been reviewed and is accurate.   New Prescriptions New Prescriptions   No medications on file     [facility] Gala Padovano, PA-C 02/09/16 1256    Courteney Lyn Corlis Leak, MD 02/10/16 1017

## 2016-02-09 NOTE — ED Notes (Signed)
Declined W/C at D/C and was escorted to lobby by RN. 

## 2016-02-09 NOTE — ED Triage Notes (Signed)
Patient complains of 1 day of generalized body aches with sore throat and fever yesterday. Ear pain with same, NAD

## 2016-02-09 NOTE — Discharge Instructions (Signed)
1. Medications: Tessalon for cough, ibuprofen as needed for fever control and/or body aches, continue usual home medications 2. Treatment: rest, drink plenty of fluids 3. Follow Up: Please follow up with your primary doctor for discussion of your diagnoses and further evaluation after today's visit if symptoms persist longer than 7 days; Return to the ER for high fevers, difficulty breathing or other concerning symptoms

## 2016-05-03 HISTORY — PX: FOOT SURGERY: SHX648

## 2016-05-18 ENCOUNTER — Emergency Department (HOSPITAL_COMMUNITY)
Admission: EM | Admit: 2016-05-18 | Discharge: 2016-05-18 | Disposition: A | Discharge status: Home / Self Care | Payer: 59 | Attending: Emergency Medicine | Admitting: Emergency Medicine

## 2016-05-18 ENCOUNTER — Encounter (HOSPITAL_COMMUNITY): Payer: Self-pay | Admitting: Emergency Medicine

## 2016-05-18 DIAGNOSIS — Z79899 Other long term (current) drug therapy: Secondary | ICD-10-CM | POA: Insufficient documentation

## 2016-05-18 DIAGNOSIS — J020 Streptococcal pharyngitis: Secondary | ICD-10-CM | POA: Diagnosis not present

## 2016-05-18 DIAGNOSIS — F17200 Nicotine dependence, unspecified, uncomplicated: Secondary | ICD-10-CM | POA: Diagnosis not present

## 2016-05-18 DIAGNOSIS — J029 Acute pharyngitis, unspecified: Secondary | ICD-10-CM | POA: Diagnosis present

## 2016-05-18 LAB — RAPID STREP SCREEN (MED CTR MEBANE ONLY): STREPTOCOCCUS, GROUP A SCREEN (DIRECT): POSITIVE — AB

## 2016-05-18 MED ORDER — PENICILLIN G BENZATHINE 1200000 UNIT/2ML IM SUSP
1.2000 10*6.[IU] | Freq: Once | INTRAMUSCULAR | Status: AC
  Administered 2016-05-18: 1.2 10*6.[IU] via INTRAMUSCULAR
  Filled 2016-05-18: qty 2

## 2016-05-18 MED ORDER — HYDROCODONE-ACETAMINOPHEN 5-325 MG PO TABS: 2.0000 | ORAL_TABLET | ORAL | 0 refills | Status: DC | PRN

## 2016-05-18 MED ORDER — HYDROCODONE-ACETAMINOPHEN 5-325 MG PO TABS
2.0000 | ORAL_TABLET | Freq: Once | ORAL | Status: AC
  Administered 2016-05-18: 2 via ORAL
  Filled 2016-05-18: qty 2

## 2016-05-18 NOTE — ED Triage Notes (Signed)
P reports sore throat and difficulty swallowing x 4 days. tx with tylenol and chloraseptic spray. Also c/o r/ear pressure. Redness and swelling noted in posterior pharyx

## 2016-05-18 NOTE — Discharge Instructions (Signed)
Return if any problems.

## 2016-05-18 NOTE — ED Provider Notes (Signed)
[facility]-EMERGENCY DEPT Provider Note   CSN: [id number] Arrival date & time: 05/18/16  1056   By signing my name below, I, Teofilo Pod, attest that this documentation has been prepared under the direction and in the presence of Langston Masker, [place]. Electronically Signed: Teofilo Pod, ED Scribe. 05/18/2016. 11:51 AM.   History   Chief Complaint Chief Complaint  Patient presents with  . Sore Throat  . Otalgia    The history is provided by the patient. No language interpreter was used.   HPI Comments:  Alison Hamilton is a 36 y.o. female who presents to the Emergency Department complaining of a constant sore throat x 4 days. Pt complains of associated pain with swallowing, ear pain. She states that the ear pain is exacerbated when swallowing. Pt states that she had a fever 3 days ago that has since resolved. She states that she is otherwise healthy and notes no known allergies. Pt has taken tylenol and chloraseptic with no relief. Pt denies sick contacts. Pt denies other associated symptoms.    History reviewed. No pertinent past medical history.  There are no active problems to display for this patient.   Past Surgical History:  Procedure Laterality Date  . WISDOM TOOTH EXTRACTION      OB History    No data available       Home Medications    Prior to Admission medications   Medication Sig Start Date End Date Taking? Authorizing Provider  amoxicillin-clavulanate (AUGMENTIN) 875-125 MG per tablet Take 1 tablet by mouth every 12 (twelve) hours. Patient not taking: Reported on 06/11/2014 04/10/13   Rodolph Bong, MD  benzonatate (TESSALON) 100 MG capsule Take 1 capsule (100 mg total) by mouth 3 (three) times daily as needed for cough. 02/09/16   Jaime Pilcher Ward, PA-C  ibuprofen (ADVIL,MOTRIN) 600 MG tablet Take 1 tablet (600 mg total) by mouth every 6 (six) hours as needed for moderate pain. 02/09/16   Chase Picket Ward, PA-C    Family History History reviewed.  No pertinent family history.  Social History Social History  Substance Use Topics  . Smoking status: Current Every Day Smoker    Packs/day: 0.50  . Smokeless tobacco: Never Used  . Alcohol use No     Allergies   Patient has no known allergies.   Review of Systems Review of Systems  Constitutional: Negative for fever.  HENT: Positive for ear pain, sore throat and trouble swallowing.      Physical Exam Updated Vital Signs BP 127/86 (BP Location: Left Arm)   Pulse 93   Temp 99.3 F (37.4 C) (Oral)   Resp 16   Wt 160 lb (72.6 kg)   LMP 05/17/2016 (Exact Date)   SpO2 100%   BMI 28.34 kg/m   Physical Exam  Constitutional: She appears well-developed and well-nourished. No distress.  HENT:  Head: Normocephalic and atraumatic.  Right Ear: Tympanic membrane normal.  Left Ear: Tympanic membrane normal.  Enlarged tonsils bilaterally, uvula midline, hoarse voice  Eyes: Conjunctivae are normal.  Cardiovascular: Normal rate and regular rhythm.   Pulmonary/Chest: Effort normal and breath sounds normal.  Abdominal: She exhibits no distension.  Neurological: She is alert.  Skin: Skin is warm and dry.  Psychiatric: She has a normal mood and affect.  Nursing note and vitals reviewed.    ED Treatments / Results  DIAGNOSTIC STUDIES:  Oxygen Saturation is 100% on RA, normal by my interpretation.    COORDINATION OF CARE:  11:51  AM Discussed treatment plan with pt at bedside and pt agreed to plan.   Labs (all labs ordered are listed, but only abnormal results are displayed) Labs Reviewed  RAPID STREP SCREEN (NOT AT [facility])    EKG  EKG Interpretation None       Radiology No results found.  Procedures Procedures (including critical care time)  Medications Ordered in ED Medications - No data to display   Initial Impression / Assessment and Plan / ED Course  I have reviewed the triage vital signs and the nursing notes.  Pertinent labs & imaging results that  were available during my care of the patient were reviewed by me and considered in my medical decision making (see chart for details).  Clinical Course       Final Clinical Impressions(s) / ED Diagnoses   Final diagnoses:  Strep pharyngitis    New Prescriptions Discharge Medication List as of 05/18/2016 12:48 PM    START taking these medications   Details  HYDROcodone-acetaminophen (NORCO/VICODIN) 5-325 MG tablet Take 2 tablets by mouth every 4 (four) hours as needed., Starting Tue 05/18/2016, Print       An After Visit Summary was printed and given to the patient.   Elson AreasLeslie K Jayland Null, PA-C 05/18/16 1916  I personally performed the services in this documentation, which was scribed in my presence.  The recorded information has been reviewed and considered.   Barnet PallKaren SofiaPAC.   Lonia SkinnerLeslie K WallingtonSofia, PA-C 05/18/16 1917    Azalia BilisKevin Campos, MD 05/19/16 [phone]

## 2017-02-17 ENCOUNTER — Encounter: Payer: Self-pay | Admitting: Physician Assistant

## 2017-02-17 ENCOUNTER — Ambulatory Visit (INDEPENDENT_AMBULATORY_CARE_PROVIDER_SITE_OTHER): Payer: BLUE CROSS/BLUE SHIELD | Admitting: Physician Assistant

## 2017-02-17 VITALS — BP 98/64 | HR 66 | Temp 98.8°F | Resp 18 | Ht 67.0 in | Wt 165.0 lb

## 2017-02-17 DIAGNOSIS — R05 Cough: Secondary | ICD-10-CM | POA: Diagnosis not present

## 2017-02-17 DIAGNOSIS — F1911 Other psychoactive substance abuse, in remission: Secondary | ICD-10-CM | POA: Insufficient documentation

## 2017-02-17 DIAGNOSIS — F17200 Nicotine dependence, unspecified, uncomplicated: Secondary | ICD-10-CM | POA: Diagnosis not present

## 2017-02-17 DIAGNOSIS — R059 Cough, unspecified: Secondary | ICD-10-CM

## 2017-02-17 MED ORDER — BENZONATATE 100 MG PO CAPS: ORAL_CAPSULE | ORAL | 0 refills | Status: AC

## 2017-02-17 MED ORDER — FLUTICASONE PROPIONATE 50 MCG/ACT NA SUSP: 2.0000 | Freq: Every day | NASAL | 6 refills | Status: DC

## 2017-02-17 MED ORDER — PREDNISONE 10 MG PO TABS: ORAL_TABLET | ORAL | 0 refills | Status: AC

## 2017-02-17 MED ORDER — VARENICLINE TARTRATE 1 MG PO TABS: 1.0000 mg | ORAL_TABLET | Freq: Two times a day (BID) | ORAL | 0 refills | Status: DC

## 2017-02-17 MED ORDER — VARENICLINE TARTRATE 0.5 MG X 11 & 1 MG X 42 PO MISC: ORAL | 0 refills | Status: DC

## 2017-02-17 NOTE — Patient Instructions (Addendum)
     IF you received an x-ray today, you will receive an invoice from [facility]. Please contact [facility] at [phone] with questions or concerns regarding your invoice.   IF you received labwork today, you will receive an invoice from [facility]. Please contact [facility] at [phone] with questions or concerns regarding your invoice.   Our billing staff will not be able to assist you with questions regarding bills from these companies.  You will be contacted with the lab results as soon as they are available. The fastest way to get your results is to activate your My Chart account. Instructions are located on the last page of this paperwork. If you have not heard from us regarding the results in 2 weeks, please contact this office.    Start the medication for the cough and get the chantix filled when you get your insurance card to help pay for it.  See me in 2 months

## 2017-02-17 NOTE — Progress Notes (Signed)
Alison Hamilton  MRN: [id number] DOB: 06-09-1980  PCP: Morrell RiddleWeber, Tersea Aulds L, PA-C  Chief Complaint  Patient presents with  . Cough    [id number]     Subjective:  Pt presents to clinic for a cough that she has had at least a month. The cough started without a cold - but overall it seems to have gotten worse - she thinks that she has allergies.  It wakes her up at night.  Leads to gagging.  Dry cough.  Feels like a tickle.  No heartburn.  Worse at night but still coughs all day.  No wheezing.  Amounts of pillows has not changed.  She has no SOB or wheezing. She has been smoking since she was 36 y/o.  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  She also has a spot on the bottom of her left foot and a cyst on her face that she would like dealt with in the future.  She has not taken care of herself and she is ready to do that.  History is obtained by patient.  Review of Systems  Constitutional: Negative for chills and fever.  HENT: Negative.  Negative for congestion.   Respiratory: Positive for cough. Negative for shortness of breath and wheezing.     Patient Active Problem List   Diagnosis Date Noted  . History of substance abuse 02/17/2017    No current outpatient prescriptions on file prior to visit.   No current facility-administered medications on file prior to visit.     No Known Allergies  No past medical history on file. Social History   Social History Narrative   Lives with 2 children      Social History  Substance Use Topics  . Smoking status: Current Every Day Smoker    Packs/day: 0.50  . Smokeless tobacco: Never Used  . Alcohol use No     Comment: hx of ETOH abuse - none since 2011   family history is not on file.     Objective:  BP 98/64   Pulse 66   Temp 98.8 F (37.1 C)  (Oral)   Resp 18   Ht 5\' 7"  (1.702 m)   Wt 165 lb (74.8 kg)   SpO2 97%   BMI 25.84 kg/m  Body mass index is 25.84 kg/m.  Physical Exam  Constitutional: She is oriented to person, place, and time and well-developed, well-nourished, and in no distress.  HENT:  Head: Normocephalic and atraumatic.  Right Ear: Hearing, tympanic membrane, external ear and ear canal normal.  Left Ear: Hearing, tympanic membrane, external ear and ear canal normal.  Nose: Mucosal edema (pale) present.  Mouth/Throat: Uvula is midline, oropharynx is clear and moist and mucous membranes are normal.  Eyes: Conjunctivae are normal.  Neck: Normal range of motion.  Cardiovascular: Normal rate, regular rhythm and normal heart sounds.   No murmur heard. Pulmonary/Chest: Effort normal and breath sounds normal.  Neurological: She is alert and oriented to person, place, and time. Gait normal.  Skin: Skin is warm and dry.  Psychiatric: Mood, memory, affect and judgment normal.  Vitals reviewed.   Assessment and Plan :  Cough - Plan: benzonatate (TESSALON) 100 MG capsule, predniSONE (DELTASONE) 10 MG tablet, fluticasone (FLONASE) 50 MCG/ACT nasal spray  Smoker - Plan: varenicline (CHANTIX PAK) 0.5 MG X 11 & 1 MG X 42 tablet, varenicline (CHANTIX) 1 MG tablet   I suspect this is PND and allergies along with smoking history.  We will treat allergies and her inflammatory cough as well as start smoking cessation with chantix.  We discussed ways to think about her smoking habit vs treatment for stress/anxiety.  She will start working on coping mechanisms to help while she is waiting to get her health insurance card.  She will recheck with me in 2 months of chantix as well she will make an appt to discuss foot and facial cyst.  Benny Lennert PA-C  Primary Care at [facility] 02/17/2017 2:50 PM

## 2017-03-08 ENCOUNTER — Encounter: Payer: Self-pay | Admitting: Physician Assistant

## 2017-03-08 ENCOUNTER — Ambulatory Visit: Payer: BLUE CROSS/BLUE SHIELD | Admitting: Physician Assistant

## 2017-03-08 VITALS — BP 106/74 | HR 94 | Temp 98.7°F | Resp 18 | Ht 67.0 in | Wt 166.0 lb

## 2017-03-08 DIAGNOSIS — Z23 Encounter for immunization: Secondary | ICD-10-CM

## 2017-03-08 DIAGNOSIS — L84 Corns and callosities: Secondary | ICD-10-CM | POA: Diagnosis not present

## 2017-03-08 NOTE — Progress Notes (Signed)
Alison Hamilton  MRN: [id number] DOB: Dec 17, 1980  PCP: Morrell RiddleWeber, Jishnu Jenniges L, PA-C  Chief Complaint  Patient presents with  . Foot Injury    both feet hurt but pt states something is on bottom of left foot     Subjective:  Pt presents to clinic for lesions on both her feet that she would like removed.  They have been bothering her for a long time and she would like them fixed today.  History is obtained by patient.    Review of Systems  Constitutional: Negative for chills and fever.  Skin: Positive for wound.    Patient Active Problem List   Diagnosis Date Noted  . History of substance abuse 02/17/2017    Current Outpatient Medications on File Prior to Visit  Medication Sig Dispense Refill  . fluticasone (FLONASE) 50 MCG/ACT nasal spray Place 2 sprays into both nostrils daily. 16 g 6  . varenicline (CHANTIX PAK) 0.5 MG X 11 & 1 MG X 42 tablet Take 0.5 mg tablet by mouth qd for 3 days, then increase to 0.5 mg tablet bid for 4 days, then increase to 1 mg tablet bid 53 tablet 0  . varenicline (CHANTIX) 1 MG tablet Take 1 tablet (1 mg total) by mouth 2 (two) times daily. 60 tablet 0   No current facility-administered medications on file prior to visit.     No Known Allergies  No past medical history on file. Social History   Social History Narrative   Lives with 2 children   Social History   Tobacco Use  . Smoking status: Current Every Day Smoker    Packs/day: 1.00    Years: 50.00    Pack years: 50.00    Start date: 05/03/1985  . Smokeless tobacco: Never Used  Substance Use Topics  . Alcohol use: No    Comment: hx of ETOH abuse - none since 2011  . Drug use: No    Comment: history of crack and ETOH abuse   family history is not on file.     Objective:  BP 106/74   Pulse 94   Temp 98.7 F (37.1 C) (Oral)   Resp 18   Ht 5\' 7"  (1.702 m)   Wt 166 lb (75.3 kg)   SpO2 97%   BMI 26.00 kg/m  Body mass index is 26 kg/m.  Physical Exam  Constitutional: She is  oriented to person, place, and time and well-developed, well-nourished, and in no distress.  HENT:  Head: Normocephalic and atraumatic.  Right Ear: Hearing and external ear normal.  Left Ear: Hearing and external ear normal.  Eyes: Conjunctivae are normal.  Neck: Normal range of motion.  Pulmonary/Chest: Effort normal.  Neurological: She is alert and oriented to person, place, and time. Gait normal.  Skin: Skin is warm and dry.  Bilateral callus Right foot - 5th lateral toe corn about 1cm in width Left foot - callus ? Plantar wart on bottom/lateral foot over 5th MTP  Psychiatric: Mood, memory, affect and judgment normal.  Vitals reviewed.  Procedure:  Consent obtained -  Local anesthesia with 2% lido.  Both lesions were pared down and surrounding callus was removed to skin level.  Pt tolerated well.  Drsg were placed and wound care was discussed with patient. Assessment and Plan :  Callus of foot - pt to get corn pads to help with pressure points on her feet - she will wear them during the healing process.    Need for influenza  vaccination - Plan: Flu Vaccine QUAD 36+ mos IM  She will f/u for the lesion on her face that she wants removed.  Benny LennertSarah Evany Schecter PA-C  Primary Care at [facility] 03/11/2017 1:33 PM

## 2017-03-08 NOTE — Patient Instructions (Signed)
     IF you received an x-ray today, you will receive an invoice from [facility]. Please contact [facility] at [phone] with questions or concerns regarding your invoice.   IF you received labwork today, you will receive an invoice from [facility]. Please contact [facility] at [phone] with questions or concerns regarding your invoice.   Our billing staff will not be able to assist you with questions regarding bills from these companies.  You will be contacted with the lab results as soon as they are available. The fastest way to get your results is to activate your My Chart account. Instructions are located on the last page of this paperwork. If you have not heard from us regarding the results in 2 weeks, please contact this office.     

## 2017-03-10 ENCOUNTER — Telehealth: Payer: Self-pay | Admitting: Physician Assistant

## 2017-03-10 NOTE — Telephone Encounter (Signed)
Spoke with pt.  States she had plantar wart removed on one foot and large callous removed on the other.  Painful and unable to work - she stands on her feet all day.   Note written to be out of work 11-7 through 11/9.  Pts mom to pick up.

## 2017-03-10 NOTE — Telephone Encounter (Unsigned)
Copied from CRM #[id number]. >> Mar 10, 2017  9:38 AM Raquel SarnaHayes, Teresa G wrote:  Pt needs  a Dr.Doctors' Note  Pt out of work for 2 days  Pt's Mother Vela ProseSharon Thompson can come by to pick it up around 4 pm today  Please call pt back to let her know it is ready.

## 2017-03-22 ENCOUNTER — Ambulatory Visit (INDEPENDENT_AMBULATORY_CARE_PROVIDER_SITE_OTHER): Payer: BLUE CROSS/BLUE SHIELD | Admitting: Physician Assistant

## 2017-03-22 ENCOUNTER — Ambulatory Visit: Payer: BLUE CROSS/BLUE SHIELD | Admitting: Family Medicine

## 2017-03-22 ENCOUNTER — Other Ambulatory Visit: Payer: Self-pay

## 2017-03-22 ENCOUNTER — Encounter: Payer: Self-pay | Admitting: Family Medicine

## 2017-03-22 VITALS — BP 112/76 | HR 89 | Temp 98.6°F | Resp 18 | Ht 67.0 in | Wt 168.0 lb

## 2017-03-22 DIAGNOSIS — L723 Sebaceous cyst: Secondary | ICD-10-CM

## 2017-03-22 DIAGNOSIS — R05 Cough: Secondary | ICD-10-CM | POA: Diagnosis not present

## 2017-03-22 DIAGNOSIS — F17200 Nicotine dependence, unspecified, uncomplicated: Secondary | ICD-10-CM | POA: Diagnosis not present

## 2017-03-22 DIAGNOSIS — R059 Cough, unspecified: Secondary | ICD-10-CM

## 2017-03-22 MED ORDER — FLUTICASONE PROPIONATE HFA 110 MCG/ACT IN AERO: 2.0000 | INHALATION_SPRAY | Freq: Two times a day (BID) | RESPIRATORY_TRACT | 12 refills | Status: DC

## 2017-03-22 MED ORDER — ALBUTEROL SULFATE HFA 108 (90 BASE) MCG/ACT IN AERS
2.0000 | INHALATION_SPRAY | RESPIRATORY_TRACT | 1 refills | Status: DC | PRN
Start: 2017-03-22 — End: 2018-01-23

## 2017-03-22 NOTE — Progress Notes (Signed)
Patient ID: Alison Hamilton, female    DOB: 23-Jan-1981, 36 y.o.   MRN: [id number]  PCP: Morrell RiddleWeber, Sarah L, PA-C  Chief Complaint  Patient presents with  . Cough    f/u  . Mass    X12 years-on face    Subjective:   Presents for evaluation of persistent cough and for excision of a sebaceous cyst from the forehead.  The cough began in August or September of this year, initially associated with a cold. She has been treated with Flonase nasal spray, tessalon (ineffective) and prednisone (VERY effective). She is taking Chantix, and finding that the cigarettes don't taste good anymore. The cough is non-productive, but is embarrassing to her. Her husband reports that it keeps them both awake at night. No SOB. No associated fever, chills.  The cyst is between the eyebrows at the top of the nose and has been present x 12 years. She had another adjacent to this that was excised years ago. She doesn't like feeling "Like a unicorn." Has tried squeezing it, but gets nothing out. Not painful or itchy, but sometimes feels tingly.  Review of Systems  Constitutional: Negative for chills and fever.  HENT: Negative for sore throat.   Eyes: Negative for visual disturbance.  Respiratory: Positive for cough. Negative for chest tightness, shortness of breath and wheezing.   Cardiovascular: Negative for chest pain and palpitations.  Gastrointestinal: Negative for abdominal pain, diarrhea, nausea and vomiting.  Genitourinary: Negative for dysuria, frequency, hematuria and urgency.  Musculoskeletal: Negative for arthralgias and myalgias.  Skin: Negative for color change, pallor, rash and wound.  Neurological: Negative for dizziness, weakness and headaches.  Psychiatric/Behavioral: Negative for decreased concentration. The patient is not nervous/anxious.        Patient Active Problem List   Diagnosis Date Noted  . History of substance abuse 02/17/2017    No Known Allergies  Prior to Admission  medications   Medication Sig Start Date End Date Taking? Authorizing Provider  fluticasone (FLONASE) 50 MCG/ACT nasal spray Place 2 sprays into both nostrils daily. 02/17/17  Yes Weber, Dema SeverinSarah L, PA-C  varenicline (CHANTIX PAK) 0.5 MG X 11 & 1 MG X 42 tablet Take 0.5 mg tablet by mouth qd for 3 days, then increase to 0.5 mg tablet bid for 4 days, then increase to 1 mg tablet bid 02/17/17  Yes Weber, Sarah L, PA-C  varenicline (CHANTIX) 1 MG tablet Take 1 tablet (1 mg total) by mouth 2 (two) times daily. 02/17/17  Yes Weber, Dema SeverinSarah L, PA-C     Past Medical, Surgical Family and Social History reviewed and updated.        Objective:  Physical Exam  Constitutional: She is oriented to person, place, and time. She appears well-developed and well-nourished. She is active and cooperative. No distress.  BP 112/76 (BP Location: Left Arm, Patient Position: Sitting, Cuff Size: Normal)   Pulse 89   Temp 98.6 F (37 C) (Oral)   Resp 18   Ht 5\' 7"  (1.702 m)   Wt 168 lb (76.2 kg)   SpO2 98%   BMI 26.31 kg/m   HENT:  Head: Normocephalic and atraumatic.    Right Ear: Hearing normal.  Left Ear: Hearing normal.  Eyes: Conjunctivae are normal. No scleral icterus.  Neck: Normal range of motion. Neck supple. No thyromegaly present.  Cardiovascular: Normal rate, regular rhythm and normal heart sounds.  Pulses:      Radial pulses are 2+ on the right side, and 2+ on  the left side.  Pulmonary/Chest: Effort normal and breath sounds normal.  Lymphadenopathy:       Head (right side): No tonsillar, no preauricular, no posterior auricular and no occipital adenopathy present.       Head (left side): No tonsillar, no preauricular, no posterior auricular and no occipital adenopathy present.    She has no cervical adenopathy.       Right: No supraclavicular adenopathy present.       Left: No supraclavicular adenopathy present.  Neurological: She is alert and oriented to person, place, and time. No sensory  deficit.  Skin: Skin is warm, dry and intact. No rash noted. No cyanosis or erythema. Nails show no clubbing.  Psychiatric: She has a normal mood and affect. Her speech is normal and behavior is normal.   PROCEDURE: Verbal consent obtained. Local anesthesia with 2 cc 2% lidocaine with epinephrine. Sterile prep and drape. 11 blade used to incise through the 2 largest dilated pores. Sebaceous material evacuated and cyst sac excised with sharps. Closed with #1 5-0 Prolene horizontal mattress suture. Area cleansed and dressed.        Assessment & Plan:  1. Sebaceous cyst Local wound care. Anticipatory guidance provided.  2. Cough 3. Smoker Counseled on continued efforts for smoking cessation. Trial of inhaled steroid and PRN albuterol inhaler. - fluticasone (FLOVENT HFA) 110 MCG/ACT inhaler; Inhale 2 puffs into the lungs 2 (two) times daily.  Dispense: 1 Inhaler; Refill: 12 - albuterol (PROVENTIL HFA;VENTOLIN HFA) 108 (90 Base) MCG/ACT inhaler; Inhale 2 puffs into the lungs every 4 (four) hours as needed for wheezing or shortness of breath (cough, shortness of breath or wheezing.).  Dispense: 1 Inhaler; Refill: 1     Return in about 5 days (around 03/27/2017) for suture removal.   Fernande Brashelle S. Juli Odom, PA-C Primary Care at [facility]

## 2017-03-22 NOTE — Patient Instructions (Addendum)
WOUND CARE Please return in 5 days to have your stitches/staples removed or sooner if you have concerns. Marland Kitchen. Keep area clean and dry for 24 hours. Do not remove bandage, if applied. . After 24 hours, remove bandage and wash wound gently with mild soap and warm water. Reapply a new bandage after cleaning wound, if directed. . Continue daily cleansing with soap and water until stitches/staples are removed. . Do not apply any ointments or creams to the wound while stitches/staples are in place, as this may cause delayed healing. . Notify the office if you experience any of the following signs of infection: Swelling, redness, pus drainage, streaking, fever >101.0 F . Notify the office if you experience excessive bleeding that does not stop after 15-20 minutes of constant, firm pressure.      IF you received an x-ray today, you will receive an invoice from [facility]. Please contact [facility] at [phone] with questions or concerns regarding your invoice.   IF you received labwork today, you will receive an invoice from [facility]. Please contact [facility] at [phone] with questions or concerns regarding your invoice.   Our billing staff will not be able to assist you with questions regarding bills from these companies.  You will be contacted with the lab results as soon as they are available. The fastest way to get your results is to activate your My Chart account. Instructions are located on the last page of this paperwork. If you have not heard from us regarding the results in 2 weeks, please contact this office.    Did you know that you begin to benefit from quitting smoking within the first twenty minutes? It's TRUE.  At 20 minutes: -blood pressure decreases -pulse rate drops -body temperature of hands and feet increases  At 8 hours: -carbon monoxide level in blood drops to normal -oxygen level in blood increases to normal  At 24 hours: -the chance  of heart attack decreases  At 48 hours: -nerve endings start regrowing -ability to smell and taste is enhanced  2 weeks-3 months: -circulation improves -walking becomes easier -lung function improves  1-9 months: -coughing, sinus congestion, fatigue and shortness of breath decreases  1 year: -excess risk of heart disease is decreased to HALF that of a smoker  5 years: Stroke risk is reduced to that of people who have never smoked  10 years: -risk of lung cancer drops to as little as half that of continuing smokers -risk of cancer of the mouth, throat, esophagus, bladder, kidney and pancreas decreases -risk of ulcer decreases  15 years -risk of heart disease is now similar to that of people who have never smoked -risk of death returns to nearly the level of people who have never smoked

## 2017-03-29 ENCOUNTER — Encounter: Payer: Self-pay | Admitting: Physician Assistant

## 2017-03-29 ENCOUNTER — Other Ambulatory Visit: Payer: Self-pay

## 2017-03-29 ENCOUNTER — Ambulatory Visit (INDEPENDENT_AMBULATORY_CARE_PROVIDER_SITE_OTHER): Payer: BLUE CROSS/BLUE SHIELD | Admitting: Physician Assistant

## 2017-03-29 VITALS — BP 118/80 | HR 90 | Temp 98.9°F | Resp 18 | Ht 67.0 in | Wt 167.8 lb

## 2017-03-29 DIAGNOSIS — L03211 Cellulitis of face: Secondary | ICD-10-CM

## 2017-03-29 DIAGNOSIS — F17200 Nicotine dependence, unspecified, uncomplicated: Secondary | ICD-10-CM | POA: Diagnosis not present

## 2017-03-29 DIAGNOSIS — Z4802 Encounter for removal of sutures: Secondary | ICD-10-CM

## 2017-03-29 DIAGNOSIS — L0231 Cutaneous abscess of buttock: Secondary | ICD-10-CM

## 2017-03-29 DIAGNOSIS — L03317 Cellulitis of buttock: Secondary | ICD-10-CM

## 2017-03-29 DIAGNOSIS — L723 Sebaceous cyst: Secondary | ICD-10-CM | POA: Diagnosis not present

## 2017-03-29 MED ORDER — DOXYCYCLINE HYCLATE 100 MG PO CAPS: 100.0000 mg | ORAL_CAPSULE | Freq: Two times a day (BID) | ORAL | 0 refills | Status: AC

## 2017-03-29 NOTE — Progress Notes (Signed)
Patient ID: Alison Hamilton, female    DOB: Mar 01, 1981, 36 y.o.   MRN: [id number]  PCP: Morrell RiddleWeber, Sarah L, PA-C  Chief Complaint  Patient presents with  . Suture / Staple Removal    Pt states her wound is infected and would like to discuss it.    Subjective:   Presents for suture removal.  Sebaceous cyst was excised from the forehead/nasal bridge on 03/22/2017. Since the excision, she notes that the lump appears to have recurred, now looks like "a white head." Not particularly tender. No drainage. No redness. No fever, chills.  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 She has one on the RIGHT buttock that began draining on her way here today. She declines my recommendation to evaluate it.   Review of Systems As above.    Patient Active Problem List   Diagnosis Date Noted  . History of substance abuse 02/17/2017     Prior to Admission medications   Medication Sig Start Date End Date Taking? Authorizing Provider  albuterol (PROVENTIL HFA;VENTOLIN HFA) 108 (90 Base) MCG/ACT inhaler Inhale 2 puffs into the lungs every 4 (four) hours as needed for wheezing or shortness of breath (cough, shortness of breath or wheezing.). 03/22/17  Yes Keondrick Dilks, PA-C  fluticasone (FLONASE) 50 MCG/ACT nasal spray Place 2 sprays into both nostrils daily. 02/17/17  Yes Weber, Sarah L, PA-C  fluticasone (FLOVENT HFA) 110 MCG/ACT inhaler Inhale 2 puffs into the lungs 2 (two) times daily. 03/22/17  Yes Hayden Kihara, PA-C  varenicline (CHANTIX PAK) 0.5 MG X 11 & 1 MG X 42 tablet Take 0.5 mg tablet by mouth qd for 3 days, then increase to 0.5 mg tablet bid for 4 days, then increase to 1 mg tablet bid 02/17/17  Yes Weber, Sarah L, PA-C  varenicline (CHANTIX) 1 MG tablet Take 1 tablet (1 mg total) by mouth 2 (two) times daily. 02/17/17  Yes Weber, Sarah L, PA-C     No Known Allergies     Objective:  Physical Exam  Constitutional: She is oriented to person, place, and time.  She appears well-developed and well-nourished. She is active and cooperative. No distress.  BP 118/80 (BP Location: Left Arm, Patient Position: Sitting, Cuff Size: Normal)   Pulse 90   Temp 98.9 F (37.2 C) (Oral)   Resp 18   Ht 5\' 7"  (1.702 m)   Wt 167 lb 12.8 oz (76.1 kg)   SpO2 100%   BMI 26.28 kg/m    HENT:  Head:    Eyes: Conjunctivae are normal.  Pulmonary/Chest: Effort normal.  Neurological: She is alert and oriented to person, place, and time.  Psychiatric: She has a normal mood and affect. Her speech is normal and behavior is normal.           Assessment & Plan:   1. Visit for suture removal 2. Sebaceous cyst Suture removed. Purulence drained. Treat for cellulitis.  3. Cellulitis of face Following sebaceous cyst excision. Purulence drained. Bandaid applied. Start doxycycline, pending wound culture. - WOUND CULTURE - doxycycline (VIBRAMYCIN) 100 MG capsule; Take 1 capsule (100 mg total) by mouth 2 (two) times daily for 10 days.  Dispense: 20 capsule; Refill: 0  4. Cellulitis and abscess of buttock Empiric therapy. This lesion not evaluated. - doxycycline (VIBRAMYCIN) 100 MG capsule; Take 1 capsule (100 mg total) by mouth 2 (two) times daily for 10 days.  Dispense: 20 capsule; Refill: 0  5. Smoker Encouraged efforts for smoking cessation.  Return if symptoms worsen or fail to improve.   Fernande Brashelle S. Findley Vi, PA-C Primary Care at [facility]

## 2017-03-29 NOTE — Patient Instructions (Addendum)
Apply a warm compress to both areas of infection. Take the antibiotic with food, even though the label will say to take it on an empty stomach.    IF you received an x-ray today, you will receive an invoice from [facility]. Please contact [facility] at [phone] with questions or concerns regarding your invoice.   IF you received labwork today, you will receive an invoice from [facility]. Please contact [facility] at [phone] with questions or concerns regarding your invoice.   Our billing staff will not be able to assist you with questions regarding bills from these companies.  You will be contacted with the lab results as soon as they are available. The fastest way to get your results is to activate your My Chart account. Instructions are located on the last page of this paperwork. If you have not heard from us regarding the results in 2 weeks, please contact this office.    Did you know that you begin to benefit from quitting smoking within the first twenty minutes? It's TRUE.  At 20 minutes: -blood pressure decreases -pulse rate drops -body temperature of hands and feet increases  At 8 hours: -carbon monoxide level in blood drops to normal -oxygen level in blood increases to normal  At 24 hours: -the chance of heart attack decreases  At 48 hours: -nerve endings start regrowing -ability to smell and taste is enhanced  2 weeks-3 months: -circulation improves -walking becomes easier -lung function improves  1-9 months: -coughing, sinus congestion, fatigue and shortness of breath decreases  1 year: -excess risk of heart disease is decreased to HALF that of a smoker  5 years: Stroke risk is reduced to that of people who have never smoked  10 years: -risk of lung cancer drops to as little as half that of continuing smokers -risk of cancer of the mouth, throat, esophagus, bladder, kidney and pancreas decreases -risk of ulcer decreases  15  years -risk of heart disease is now similar to that of people who have never smoked -risk of death returns to nearly the level of people who have never smoked

## 2017-04-01 LAB — WOUND CULTURE: ORGANISM ID, BACTERIA: NONE SEEN

## 2017-06-30 ENCOUNTER — Ambulatory Visit (INDEPENDENT_AMBULATORY_CARE_PROVIDER_SITE_OTHER): Payer: BLUE CROSS/BLUE SHIELD | Admitting: Physician Assistant

## 2017-06-30 ENCOUNTER — Encounter: Payer: Self-pay | Admitting: Physician Assistant

## 2017-06-30 ENCOUNTER — Ambulatory Visit: Payer: Self-pay | Admitting: *Deleted

## 2017-06-30 ENCOUNTER — Other Ambulatory Visit: Payer: Self-pay

## 2017-06-30 ENCOUNTER — Ambulatory Visit (INDEPENDENT_AMBULATORY_CARE_PROVIDER_SITE_OTHER): Payer: BLUE CROSS/BLUE SHIELD

## 2017-06-30 VITALS — BP 118/80 | HR 80 | Temp 98.8°F | Resp 18 | Ht 67.32 in | Wt 169.8 lb

## 2017-06-30 DIAGNOSIS — R058 Other specified cough: Secondary | ICD-10-CM

## 2017-06-30 DIAGNOSIS — R059 Cough, unspecified: Secondary | ICD-10-CM

## 2017-06-30 DIAGNOSIS — R609 Edema, unspecified: Secondary | ICD-10-CM

## 2017-06-30 DIAGNOSIS — R05 Cough: Secondary | ICD-10-CM | POA: Diagnosis not present

## 2017-06-30 DIAGNOSIS — F17200 Nicotine dependence, unspecified, uncomplicated: Secondary | ICD-10-CM | POA: Diagnosis not present

## 2017-06-30 DIAGNOSIS — R0982 Postnasal drip: Secondary | ICD-10-CM | POA: Diagnosis not present

## 2017-06-30 MED ORDER — CETIRIZINE HCL 10 MG PO TABS: 10.0000 mg | ORAL_TABLET | Freq: Every day | ORAL | 0 refills | Status: DC

## 2017-06-30 MED ORDER — FLUTICASONE PROPIONATE 50 MCG/ACT NA SUSP: 2.0000 | Freq: Every day | NASAL | 6 refills | Status: DC

## 2017-06-30 NOTE — Telephone Encounter (Signed)
Pt has  An  Appointment  Today   With  [place]otte[place]ittany    Wiseman  At  4pm   Today  Pt  Advised  To call back if  Symptoms  Worse   Pt  Advised  To call 911  If any  Severe  Distress.   Reason for Disposition . Cough has been present for > 3 weeks  Answer Assessment - Initial Assessment Questions 1. ONSET: "When did the cough begin?"        Two  Months    2. SEVERITY: "How bad is the cough today?"          Intermittant     Non  Productive   -   Getting  Worse    3. RESPIRATORY DISTRESS: "Describe your breathing."            Worse   At  Night  Ok now   Sometimes  Has  Tightness  In  Chest   4. FEVER: "Do you have a fever?" If so, ask: "What is your temperature, how was it measured, and when did it start?"          No 5. HEMOPTYSIS: "Are you coughing up any blood?" If so ask: "How much?" (flecks, streaks, tablespoons, etc.)         streaks  Mixed  In  With  Mucous    6. TREATMENT: "What have you done so far to treat the cough?" (e.g., meds, fluids, humidifier)         Inhaler      Drinking  Lots  Of  Walker  7. CARDIAC HISTORY: "Do you have any history of heart disease?" (e.g., heart attack, congestive heart failure)           Family  History     8. LUNG HISTORY: "Do you have any history of lung disease?"  (e.g., pulmonary embolus, asthma, emphysema)             Rx inhalers    For   Cough   9. PE RISK FACTORS: "Do you have a history of blood clots?" (or: recent major surgery, recent prolonged travel, bedridden )               No 10. OTHER SYMPTOMS: "Do you have any other symptoms? (e.g., runny nose, wheezing, chest pain)        Chest  Pain   When  Coughs    11. PREGNANCY: "Is there any chance you are pregnant?" "When was your last menstrual period?"       Last  Week   12. TRAVEL: "Have you traveled out of the country in the last month?" (e.g., travel history, exposures)         No  Protocols used: COUGH - ACUTE NON-PRODUCTIVE-A-AH

## 2017-06-30 NOTE — Progress Notes (Signed)
MRN: [id number] DOB: [birth year]  Subjective:   Alison Hamilton is a 37 y.o. female presenting for chief complaint of Cough (X 2 mh off and on) and Sinus Problem .  Reports  2 month history of dry cough. Worse at night. Notes her sinuses will bother her when weather changes. Has tried mucinex, cough pills with no relief. Denies fever,orthopnea, lower leg swelling, ear pain, sore throat, wheezing, shortness of breath and chest pain, night sweats, chills, fatigue, nausea, vomiting, abdominal pain and diarrhea.Has hx of PND and sinus issues when the weather changes, no history of asthma or COPD.  Was evaluated in 01/2017 for cough x 1 month. Tx at that time with prednisone, flonase, and flovent. Cough improved but then returned. Still taking flovent but not flonase. Patient has had flu shot this season. No use of ACE-I. No recent travel. Current smoker. Used to smoke 2-3 ppd, now smokes 0.5 ppd x 30 years.  Denies any other aggravating or relieving factors, no other questions or concerns.  Alison Hamilton has a current medication list which includes the following prescription(s): albuterol, fluticasone, varenicline, varenicline, cetirizine, and fluticasone. Also has No Known Allergies.  Alison Hamilton  has no past medical history on file. Also  has a past surgical history that includes Wisdom tooth extraction.   Objective:   Vitals: BP 118/80 (BP Location: Left Arm, Patient Position: Sitting, Cuff Size: Normal)   Pulse 80   Temp 98.8 F (37.1 C) (Oral)   Resp 18   Ht 5' 7.32" (1.71 m)   Wt 169 lb 12.8 oz (77 kg)   SpO2 98%   BMI 26.34 kg/m   Physical Exam  Constitutional: She is oriented to person, place, and time. She appears well-developed and well-nourished. No distress.  HENT:  Head: Normocephalic and atraumatic.  Right Ear: Tympanic membrane is not erythematous and not bulging. A middle ear effusion is present.  Left Ear: Tympanic membrane is not erythematous and not bulging. A middle ear  effusion is present.  Nose: Mucosal edema present. Right sinus exhibits no maxillary sinus tenderness and no frontal sinus tenderness. Left sinus exhibits no maxillary sinus tenderness and no frontal sinus tenderness.  Mouth/Throat: Uvula is midline, oropharynx is clear and moist and mucous membranes are normal. No tonsillar exudate.  Eyes: Conjunctivae are normal.  Neck: Normal range of motion.  Cardiovascular: Normal rate, regular rhythm, normal heart sounds and intact distal pulses.  Pulmonary/Chest: Effort normal and breath sounds normal. No respiratory distress. She has no decreased breath sounds. She has no wheezes. She has no rhonchi. She has no rales.  Neurological: She is alert and oriented to person, place, and time.  Skin: Skin is warm and dry.  Psychiatric: She has a normal mood and affect.  Vitals reviewed.   No results found for this or any previous visit (from the past 24 hour(s)). Dg Chest 2 View  Result Date: 06/30/2017 CLINICAL DATA:  Cough for 2 months. EXAM: CHEST  2 VIEW COMPARISON:  06/11/2014 FINDINGS: The heart size and mediastinal contours are within normal limits. Both lungs are clear. The visualized skeletal structures are unremarkable. IMPRESSION: No active cardiopulmonary disease. Electronically Signed   By: Myles RosenthalJohn  Stahl M.D.   On: 06/30/2017 17:22   Assessment and Plan :  1. Cough - DG Chest 2 View; Future - cetirizine (ZYRTEC) 10 MG tablet; Take 1 tablet (10 mg total) by mouth daily.  Dispense: 30 tablet; Refill: 0 - fluticasone (FLONASE) 50 MCG/ACT nasal spray; Place 2  sprays into both nostrils daily.  Dispense: 16 g; Refill: 6 2. Mucosal edema 3. Post-nasal drainage 4. Upper airway cough syndrome 5. Smoker History and physical exam is consistent with upper airway cough syndrome.  Lungs CTAB.  Chest x-ray performed due to smoking history.  Chest x-ray with no active cardiopulmonary disease.  Patient recommended to restart Flonase and start daily cetirizine.   Continue Flovent.  Use coolmist humidifier in room at nighttime.  Strongly encouraged to decrease amount of cigarettes smoked daily.  Educated on benefits of smoking cessation.  Follow-up in office in 2 weeks for reevaluation or sooner if symptoms worsen or she develops new concerning symptoms.  Benjiman Core, PA-C  Primary Care at [facility] 06/30/2017 5:32 PM

## 2017-06-30 NOTE — Patient Instructions (Addendum)
Your chest xray was normal, which is great. We should try and gain better control over your postnasal drip and sinus issues as this could be worsening. Your cough. I would like you to restart flonase daily and use zyrtec daily. I also recommend putting a cool mist humidifier in your room. Plan to follow up in two weeks for reevaluation.    Allergic Rhinitis, Adult Allergic rhinitis is an allergic reaction that affects the mucous membrane inside the nose. It causes sneezing, a runny or stuffy nose, and the feeling of mucus going down the back of the throat (postnasal drip). Allergic rhinitis can be mild to severe. There are two types of allergic rhinitis:  Seasonal. This type is also called hay fever. It happens only during certain seasons.  Perennial. This type can happen at any time of the year.  What are the causes? This condition happens when the body's defense system (immune system) responds to certain harmless substances called allergens as though they were germs.  Seasonal allergic rhinitis is triggered by pollen, which can come from grasses, trees, and weeds. Perennial allergic rhinitis may be caused by:  House dust mites.  Pet dander.  Mold spores.  What are the signs or symptoms? Symptoms of this condition include:  Sneezing.  Runny or stuffy nose (nasal congestion).  Postnasal drip.  Itchy nose.  Tearing of the eyes.  Trouble sleeping.  Daytime sleepiness.  How is this diagnosed? This condition may be diagnosed based on:  Your medical history.  A physical exam.  Tests to check for related conditions, such as: ? Asthma. ? Pink eye. ? Ear infection. ? Upper respiratory infection.  Tests to find out which allergens trigger your symptoms. These may include skin or blood tests.  How is this treated? There is no cure for this condition, but treatment can help control symptoms. Treatment may include:  Taking medicines that block allergy symptoms, such as  antihistamines. Medicine may be given as a shot, nasal spray, or pill.  Avoiding the allergen.  Desensitization. This treatment involves getting ongoing shots until your body becomes less sensitive to the allergen. This treatment may be done if other treatments do not help.  If taking medicine and avoiding the allergen does not work, new, stronger medicines may be prescribed.  Follow these instructions at home:  Find out what you are allergic to. Common allergens include smoke, dust, and pollen.  Avoid the things you are allergic to. These are some things you can do to help avoid allergens: ? Replace carpet with wood, tile, or vinyl flooring. Carpet can trap dander and dust. ? Do not smoke. Do not allow smoking in your home. ? Change your heating and air conditioning filter at least once a month. ? During allergy season:  Keep windows closed as much as possible.  Plan outdoor activities when pollen counts are lowest. This is usually during the evening hours.  When coming indoors, change clothing and shower before sitting on furniture or bedding.  Take over-the-counter and prescription medicines only as told by your health care provider.  Keep all follow-up visits as told by your health care provider. This is important. Contact a health care provider if:  You have a fever.  You develop a persistent cough.  You make whistling sounds when you breathe (you wheeze).  Your symptoms interfere with your normal daily activities. Get help right away if:  You have shortness of breath. Summary  This condition can be managed by taking medicines as  directed and avoiding allergens.  Contact your health care provider if you develop a persistent cough or fever.  During allergy season, keep windows closed as much as possible. This information is not intended to replace advice given to you by your health care provider. Make sure you discuss any questions you have with your health care  provider. Document Released: 01/12/2001 Document Revised: 05/27/2016 Document Reviewed: 05/27/2016 Elsevier Interactive Patient Education  2018 [employer]    IF you received an x-ray today, you will receive an invoice from [facility]. Please contact [facility] at [phone] with questions or concerns regarding your invoice.   IF you received labwork today, you will receive an invoice from [facility]. Please contact [facility] at [phone] with questions or concerns regarding your invoice.   Our billing staff will not be able to assist you with questions regarding bills from these companies.  You will be contacted with the lab results as soon as they are available. The fastest way to get your results is to activate your My Chart account. Instructions are located on the last page of this paperwork. If you have not heard from us regarding the results in 2 weeks, please contact this office.

## 2017-07-27 DIAGNOSIS — L03213 Periorbital cellulitis: Secondary | ICD-10-CM | POA: Diagnosis not present

## 2017-08-09 ENCOUNTER — Other Ambulatory Visit: Payer: Self-pay

## 2017-08-09 ENCOUNTER — Ambulatory Visit (INDEPENDENT_AMBULATORY_CARE_PROVIDER_SITE_OTHER): Payer: BLUE CROSS/BLUE SHIELD | Admitting: Physician Assistant

## 2017-08-09 ENCOUNTER — Encounter: Payer: Self-pay | Admitting: Physician Assistant

## 2017-08-09 VITALS — BP 118/74 | HR 82 | Temp 98.7°F | Resp 16 | Ht 67.32 in | Wt 172.4 lb

## 2017-08-09 DIAGNOSIS — R05 Cough: Secondary | ICD-10-CM

## 2017-08-09 DIAGNOSIS — F17200 Nicotine dependence, unspecified, uncomplicated: Secondary | ICD-10-CM | POA: Diagnosis not present

## 2017-08-09 DIAGNOSIS — N644 Mastodynia: Secondary | ICD-10-CM

## 2017-08-09 DIAGNOSIS — M546 Pain in thoracic spine: Secondary | ICD-10-CM

## 2017-08-09 DIAGNOSIS — R059 Cough, unspecified: Secondary | ICD-10-CM

## 2017-08-09 MED ORDER — PREDNISONE 20 MG PO TABS: ORAL_TABLET | ORAL | 0 refills | Status: DC

## 2017-08-09 NOTE — Assessment & Plan Note (Addendum)
Counseled again on the importance of smoking cessation.

## 2017-08-09 NOTE — Patient Instructions (Addendum)
Please reduce your caffeine consumption.    IF you received an x-ray today, you will receive an invoice from [facility]. Please contact [facility] at [phone] with questions or concerns regarding your invoice.   IF you received labwork today, you will receive an invoice from [facility]. Please contact [facility] at [phone] with questions or concerns regarding your invoice.   Our billing staff will not be able to assist you with questions regarding bills from these companies.  You will be contacted with the lab results as soon as they are available. The fastest way to get your results is to activate your My Chart account. Instructions are located on the last page of this paperwork. If you have not heard from [place] regarding the results in 2 weeks, please contact this office.    Did you know that you begin to benefit from quitting smoking within the first twenty minutes? It's TRUE.  At 20 minutes: -blood pressure decreases -pulse rate drops -body temperature of hands and feet increases  At 8 hours: -carbon monoxide level in blood drops to normal -oxygen level in blood increases to normal  At 24 hours: -the chance of heart attack decreases  At 48 hours: -nerve endings start regrowing -ability to smell and taste is enhanced  2 weeks-3 months: -circulation improves -walking becomes easier -lung function improves  1-9 months: -coughing, sinus congestion, fatigue and shortness of breath decreases  1 year: -excess risk of heart disease is decreased to HALF that of a smoker  5 years: Stroke risk is reduced to that of people who have never smoked  10 years: -risk of lung cancer drops to as little as half that of continuing smokers -risk of cancer of the mouth, throat, esophagus, bladder, kidney and pancreas decreases -risk of ulcer decreases  15 years -risk of heart disease is now similar to that of people who have never smoked -risk of death returns to  nearly the level of people who have never smoked

## 2017-08-09 NOTE — Progress Notes (Signed)
Patient ID: Alison Hamilton, female    DOB: March 13, 1981, 37 y.o.   MRN: [id number]  PCP: Morrell RiddleWeber, Sarah L, PA-C  Chief Complaint  Patient presents with  . Breast Pain    radiates to her left side of her back     Subjective:   Presents for evaluation of breast and back pain.  Pain in the breasts, L>>R, began almost 2 months ago. Felt like her breasts were growing, the soreness like when she was pregnant. Developed sharp pain in the back almost 1 month ago. Pain with deep breath. Seems worse at night.  Has tried nothing to try to alleviate her pain.  Coughing, "for a while." Improved preciously with prednisone. Seen 02/17/2017, and then again 06/30/2017. Cetirizine not helping. Stressed. Has a lot going on.  Continues to smoke.  Regular menses on Mirena. Husband s/p prostate procedure, aspermia.    Review of Systems As above. No fever, chills. No GI/GU symptoms. No SOB. No drainage (except when she uses nasal spray).    Patient Active Problem List   Diagnosis Date Noted  . Smoker 03/29/2017  . History of substance abuse 02/17/2017     Prior to Admission medications   Medication Sig Start Date End Date Taking? Authorizing Provider  albuterol (PROVENTIL HFA;VENTOLIN HFA) 108 (90 Base) MCG/ACT inhaler Inhale 2 puffs into the lungs every 4 (four) hours as needed for wheezing or shortness of breath (cough, shortness of breath or wheezing.). 03/22/17  Yes Ellen Goris, PA-C  cetirizine (ZYRTEC) 10 MG tablet Take 1 tablet (10 mg total) by mouth daily. 06/30/17  Yes Barnett AbuWiseman, [place] D, PA-C  fluticasone (FLONASE) 50 MCG/ACT nasal spray Place 2 sprays into both nostrils daily. 06/30/17  Yes Barnett AbuWiseman, [place] D, PA-C  fluticasone (FLOVENT HFA) 110 MCG/ACT inhaler Inhale 2 puffs into the lungs 2 (two) times daily. 03/22/17  Yes Eleanor Dimichele, PA-C  varenicline (CHANTIX PAK) 0.5 MG X 11 & 1 MG X 42 tablet Take 0.5 mg tablet by mouth qd for 3 days, then increase to 0.5  mg tablet bid for 4 days, then increase to 1 mg tablet bid 02/17/17   Weber, Dema SeverinSarah L, PA-C  varenicline (CHANTIX) 1 MG tablet Take 1 tablet (1 mg total) by mouth 2 (two) times daily. 02/17/17   Weber, Dema SeverinSarah L, PA-C     No Known Allergies     Objective:  Physical Exam  Constitutional: She is oriented to person, place, and time. She appears well-developed and well-nourished. She is active and cooperative. No distress.  BP 118/74   Pulse 82   Temp 98.7 F (37.1 C)   Resp 16   Ht 5' 7.32" (1.71 m)   Wt 172 lb 6.4 oz (78.2 kg)   SpO2 98%   BMI 26.75 kg/m    Eyes: Conjunctivae are normal.  Neck: Neck supple. No thyromegaly present.  Cardiovascular: Normal rate, regular rhythm and normal heart sounds.  Pulmonary/Chest: Effort normal and breath sounds normal. She exhibits tenderness. She exhibits no mass, no laceration, no crepitus, no edema, no deformity, no swelling and no retraction. Right breast exhibits no inverted nipple, no mass, no nipple discharge, no skin change and no tenderness. Left breast exhibits no inverted nipple, no mass, no nipple discharge, no skin change and no tenderness. Breasts are symmetrical.  Pain in the LEFT chest and back with deep breathing. Breasts are exquisitely tender, especially in the upper outer quadrants. LEFT lateral chest wall and thoracic region exquisitely tender to light  touch.  Lymphadenopathy:    She has no cervical adenopathy.  Neurological: She is alert and oriented to person, place, and time.  Skin: Skin is warm and dry. Lesion (multiple open comedones on the upper abdominal wal and lower anterior chest wall, including inferior breasts. None with erythema, draiange, induration.) noted.  Psychiatric: Her speech is normal and behavior is normal. Her mood appears anxious. Her affect is not angry, not blunt, not labile and not inappropriate. She does not exhibit a depressed mood.       Assessment & Plan:   Problem List Items Addressed This  Visit    Smoker    Counseled again on the importance of smoking cessation.       Other Visit Diagnoses    Mastalgia    -  Primary   Reduce caffeine. Prednisone taper. Supportive bra. If persists, will recommend imaging. Intended UCG, but patient left. Pregnancy extremely unlikely.   Relevant Medications   predniSONE (DELTASONE) 20 MG tablet   Acute left-sided thoracic back pain       Likely due to coughing. Prednisone taper.   Relevant Medications   predniSONE (DELTASONE) 20 MG tablet   Cough       Encouraged smoking cessation. Improved previously with oral steroid. Repeat. may need inhaled steroid, though no wheezing. Doubt LPR.   Relevant Medications   predniSONE (DELTASONE) 20 MG tablet       Return in about 10 days (around 08/19/2017) for re-evalaution of cough, breast pain and back pain.   Fernande Bras, PA-C Primary Care at [facility]

## 2017-08-23 ENCOUNTER — Ambulatory Visit: Payer: BLUE CROSS/BLUE SHIELD | Admitting: Physician Assistant

## 2018-01-11 DIAGNOSIS — M19071 Primary osteoarthritis, right ankle and foot: Secondary | ICD-10-CM | POA: Diagnosis not present

## 2018-01-11 DIAGNOSIS — M71571 Other bursitis, not elsewhere classified, right ankle and foot: Secondary | ICD-10-CM | POA: Diagnosis not present

## 2018-01-11 DIAGNOSIS — M21622 Bunionette of left foot: Secondary | ICD-10-CM | POA: Diagnosis not present

## 2018-01-18 DIAGNOSIS — M2041 Other hammer toe(s) (acquired), right foot: Secondary | ICD-10-CM | POA: Diagnosis not present

## 2018-01-23 ENCOUNTER — Ambulatory Visit (HOSPITAL_COMMUNITY)
Admission: EM | Admit: 2018-01-23 | Discharge: 2018-01-23 | Disposition: A | Discharge status: Home / Self Care | Payer: BLUE CROSS/BLUE SHIELD | Attending: Family Medicine | Admitting: Family Medicine

## 2018-01-23 ENCOUNTER — Encounter (HOSPITAL_COMMUNITY): Payer: Self-pay | Admitting: Emergency Medicine

## 2018-01-23 DIAGNOSIS — M542 Cervicalgia: Secondary | ICD-10-CM

## 2018-01-23 DIAGNOSIS — R109 Unspecified abdominal pain: Secondary | ICD-10-CM

## 2018-01-23 DIAGNOSIS — M62838 Other muscle spasm: Secondary | ICD-10-CM | POA: Diagnosis not present

## 2018-01-23 MED ORDER — CYCLOBENZAPRINE HCL 10 MG PO TABS: 10.0000 mg | ORAL_TABLET | Freq: Two times a day (BID) | ORAL | 0 refills | Status: DC | PRN

## 2018-01-23 MED ORDER — MELOXICAM 7.5 MG PO TABS: 7.5000 mg | ORAL_TABLET | Freq: Every day | ORAL | 0 refills | Status: DC
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
## 2018-01-23 NOTE — Discharge Instructions (Signed)
Hydrate well with at least 2 liters (64 ounces) of water daily.  
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
## 2018-01-30 ENCOUNTER — Other Ambulatory Visit: Payer: Self-pay | Admitting: Podiatry

## 2018-01-30 ENCOUNTER — Ambulatory Visit (INDEPENDENT_AMBULATORY_CARE_PROVIDER_SITE_OTHER): Payer: BLUE CROSS/BLUE SHIELD

## 2018-01-30 ENCOUNTER — Encounter: Payer: Self-pay | Admitting: Podiatry

## 2018-01-30 ENCOUNTER — Ambulatory Visit (INDEPENDENT_AMBULATORY_CARE_PROVIDER_SITE_OTHER): Payer: BLUE CROSS/BLUE SHIELD | Admitting: Podiatry

## 2018-01-30 VITALS — BP 112/74 | HR 80

## 2018-01-30 DIAGNOSIS — M21622 Bunionette of left foot: Secondary | ICD-10-CM

## 2018-01-30 DIAGNOSIS — M2041 Other hammer toe(s) (acquired), right foot: Secondary | ICD-10-CM

## 2018-01-30 DIAGNOSIS — M79671 Pain in right foot: Secondary | ICD-10-CM | POA: Diagnosis not present

## 2018-01-30 DIAGNOSIS — M79672 Pain in left foot: Secondary | ICD-10-CM | POA: Diagnosis not present

## 2018-01-30 NOTE — Patient Instructions (Signed)
Pre-Operative Instructions  Congratulations, you have decided to take an important step towards improving your quality of life.  You can be assured that the doctors and staff at [facility] will be with you every step of the way.  Here are some important things you should know:  1. Plan to be at the surgery center/hospital at least 1 (one) hour prior to your scheduled time, unless otherwise directed by the surgical center/hospital staff.  You must have a responsible adult accompany you, remain during the surgery and drive you home.  Make sure you have directions to the surgical center/hospital to ensure you arrive on time. 2. If you are having surgery at [facility] or [facility], you will need a copy of your medical history and physical form from your family physician within one month prior to the date of surgery. We will give you a form for your primary physician to complete.  3. We make every effort to accommodate the date you request for surgery.  However, there are times where surgery dates or times have to be moved.  We will contact you as soon as possible if a change in schedule is required.   4. No aspirin/ibuprofen for one week before surgery.  If you are on aspirin, any non-steroidal anti-inflammatory medications (Mobic, Aleve, Ibuprofen) should not be taken seven (7) days prior to your surgery.  You make take Tylenol for pain prior to surgery.  5. Medications - If you are taking daily heart and blood pressure medications, seizure, reflux, allergy, asthma, anxiety, pain or diabetes medications, make sure you notify the surgery center/hospital before the day of surgery so they can tell you which medications you should take or avoid the day of surgery. 6. No food or drink after midnight the night before surgery unless directed otherwise by surgical center/hospital staff. 7. No alcoholic beverages 24-hours prior to surgery.  No smoking 24-hours prior or 24-hours after  surgery. 8. Wear loose pants or shorts. They should be loose enough to fit over bandages, boots, and casts. 9. Don't wear slip-on shoes. Sneakers are preferred. 10. Bring your boot with you to the surgery center/hospital.  Also bring crutches or a walker if your physician has prescribed it for you.  If you do not have this equipment, it will be provided for you after surgery. 11. If you have not been contacted by the surgery center/hospital by the day before your surgery, call to confirm the date and time of your surgery. 12. Leave-time from work may vary depending on the type of surgery you have.  Appropriate arrangements should be made prior to surgery with your employer. 13. Prescriptions will be provided immediately following surgery by your doctor.  Fill these as soon as possible after surgery and take the medication as directed. Pain medications will not be refilled on weekends and must be approved by the doctor. 14. Remove nail polish on the operative foot and avoid getting pedicures prior to surgery. 15. Wash the night before surgery.  The night before surgery wash the foot and leg well with water and the antibacterial soap provided. Be sure to pay special attention to beneath the toenails and in between the toes.  Wash for at least three (3) minutes. Rinse thoroughly with water and dry well with a towel.  Perform this wash unless told not to do so by your physician.  Enclosed: 1 Ice pack (please put in freezer the night before surgery)   1 Hibiclens skin cleaner     Pre-op instructions  If you have any questions regarding the instructions, please do not hesitate to call our office.  [place]: [address], [place], [place] [address] -- [phone]  [place]: [address] [place], [place] [address] -- [phone]  [place]: [address]  [place], [place] [address] -- [phone]  [place]: [address], [address], [place], [place] [address] -- [phone]  Website:  [email or web address] 

## 2018-01-30 NOTE — Progress Notes (Signed)
   HPI: 37 year old female presents the office today as a new patient in referral from Dr. Elijah Birk for evaluation of pain to the bilateral feet.  Pain is been present for approximately 1 year now with a gradual onset.  Patient denies any history of trauma.  Patient has been treated multiple times by Dr. Elijah Birk without any alleviation of symptoms.  She does have some callus lesions that are routinely trimmed and she does get some relief for only a few days.  History reviewed. No pertinent past medical history.   Physical Exam: General: The patient is alert and oriented x3 in no acute distress.  Dermatology: Hyperkeratotic callus tissue noted overlying the PIPJ of the fifth digit right foot as well as the sub-fifth MPJ of the left foot.  Skin is warm, dry and supple bilateral lower extremities. Negative for open lesions or macerations.  Vascular: Palpable pedal pulses bilaterally. No edema or erythema noted. Capillary refill within normal limits.  Neurological: Epicritic and protective threshold grossly intact bilaterally.   Musculoskeletal Exam: Clinical evidence of hammertoe deformity to the fifth digit right foot as well as a tailor's bunion deformity left foot range of motion within normal limits to all pedal and ankle joints bilateral. Muscle strength 5/5 in all groups bilateral.   Radiographic Exam:  Normal osseous mineralization. Joint spaces preserved. No fracture/dislocation/boney destruction.  Increased intermetatarsal angle of the fourth intermetatarsal space of the left foot consistent with a tailor's bunion deformity.  Lateral deviation of the head of the fifth metatarsal noted.  Assessment: 1.  Hammertoe fifth digit right foot with overlying symptomatic callus 2.  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 Patient evaluated. X-Rays reviewed.  2. Today we discussed the conservative versus surgical management of the presenting pathology. The patient opts  for surgical management. All possible complications and details of the procedure were explained. All patient questions were answered. No guarantees were expressed or implied. 3. Authorization for surgery was initiated today. Surgery will consist of PIPJ arthroplasty with derotational skin plasty fifth digit right foot.  Tailor's bunionectomy with metatarsal osteotomy left foot 4.  Return to clinic 1 week postop        Felecia Shelling, DPM [facility]  Dr. Felecia Shelling, DPM    [address] [place], [place] [address]                Office [phone]  Fax [phone]

## 2018-02-01 ENCOUNTER — Ambulatory Visit: Payer: BLUE CROSS/BLUE SHIELD | Admitting: Podiatry

## 2018-02-23 ENCOUNTER — Other Ambulatory Visit: Payer: Self-pay | Admitting: Podiatry

## 2018-02-23 ENCOUNTER — Encounter: Payer: Self-pay | Admitting: Podiatry

## 2018-02-23 DIAGNOSIS — M21542 Acquired clubfoot, left foot: Secondary | ICD-10-CM | POA: Diagnosis not present

## 2018-02-23 DIAGNOSIS — M2041 Other hammer toe(s) (acquired), right foot: Secondary | ICD-10-CM | POA: Diagnosis not present

## 2018-02-23 DIAGNOSIS — M21622 Bunionette of left foot: Secondary | ICD-10-CM | POA: Diagnosis not present

## 2018-02-23 DIAGNOSIS — Z01818 Encounter for other preprocedural examination: Secondary | ICD-10-CM | POA: Diagnosis not present

## 2018-02-23 DIAGNOSIS — M2012 Hallux valgus (acquired), left foot: Secondary | ICD-10-CM | POA: Diagnosis not present

## 2018-02-23 MED ORDER — OXYCODONE-ACETAMINOPHEN 5-325 MG PO TABS: 1.0000 | ORAL_TABLET | Freq: Four times a day (QID) | ORAL | 0 refills | Status: DC | PRN

## 2018-02-23 MED ORDER — IBUPROFEN 600 MG PO TABS: 600.0000 mg | ORAL_TABLET | Freq: Three times a day (TID) | ORAL | 0 refills | Status: DC | PRN

## 2018-02-23 NOTE — Progress Notes (Signed)
Postop pain management 

## 2018-03-01 ENCOUNTER — Ambulatory Visit (INDEPENDENT_AMBULATORY_CARE_PROVIDER_SITE_OTHER): Payer: Self-pay | Admitting: Podiatry

## 2018-03-01 ENCOUNTER — Encounter: Payer: Self-pay | Admitting: Podiatry

## 2018-03-01 ENCOUNTER — Ambulatory Visit (INDEPENDENT_AMBULATORY_CARE_PROVIDER_SITE_OTHER): Payer: BLUE CROSS/BLUE SHIELD

## 2018-03-01 DIAGNOSIS — M21622 Bunionette of left foot: Secondary | ICD-10-CM

## 2018-03-01 DIAGNOSIS — Z9889 Other specified postprocedural states: Secondary | ICD-10-CM

## 2018-03-01 DIAGNOSIS — M2041 Other hammer toe(s) (acquired), right foot: Secondary | ICD-10-CM

## 2018-03-06 NOTE — Progress Notes (Signed)
   Subjective:  Patient presents today status post right 5th toe arthroplasty and left Tailor's bunionectomy. DOS: 02/23/18. She reports the right foot is doing well. She denies any significant pain or modifying factors of the right foot. She reports moderate pain of the left foot with associated swelling. She has been taking the pain medication with no significant relief. There are no modifying factors noted. Patient is here for further evaluation and treatment.    History reviewed. No pertinent past medical history.    Objective/Physical Exam Neurovascular status intact.  Skin incisions appear to be well coapted with sutures and staples intact. No sign of infectious process noted. No dehiscence. No active bleeding noted. Moderate edema noted to the surgical extremity.  Radiographic Exam:  Orthopedic hardware and osteotomies sites appear to be stable with routine healing.  Assessment: 1. s/p right 5th toe arthroplasty and left Tailor's bunionectomy. DOS: 02/23/18.    Plan of Care:  1. Patient was evaluated. X-rays reviewed 2. Dressing changed.  3. Continue weightbearing in post op shoe.  4. Return to clinic in one week for suture removal.    Felecia Shelling, DPM [facility]  Dr. Felecia Shelling, DPM    [address]                                        [place], [place] [address]                Office [phone]  Fax [phone]

## 2018-03-08 ENCOUNTER — Ambulatory Visit (INDEPENDENT_AMBULATORY_CARE_PROVIDER_SITE_OTHER): Payer: BLUE CROSS/BLUE SHIELD | Admitting: Podiatry

## 2018-03-08 DIAGNOSIS — M21622 Bunionette of left foot: Secondary | ICD-10-CM

## 2018-03-08 DIAGNOSIS — M2041 Other hammer toe(s) (acquired), right foot: Secondary | ICD-10-CM

## 2018-03-08 DIAGNOSIS — Z9889 Other specified postprocedural states: Secondary | ICD-10-CM

## 2018-03-12 NOTE — Progress Notes (Signed)
   Subjective:  Patient presents today status post right 5th toe arthroplasty and left Tailor's bunionectomy. DOS: 02/23/18. She states she is doing well. She denies any significant pain or modifying factors. She has been using the post op shoe on the left foot as directed. She denies any new complaints. Patient is here for further evaluation and treatment.    No past medical history on file.    Objective/Physical Exam Neurovascular status intact. Skin incisions appear to be well coapted with sutures and staples intact. No sign of infectious process noted. No dehiscence. No active bleeding noted. Moderate edema noted to the surgical extremity.  Assessment: 1. s/p right 5th toe arthroplasty and left Tailor's bunionectomy. DOS: 02/23/18.    Plan of Care:  1. Patient was evaluated.  2. Sutures removed.  3. Continue using post op shoe for the left foot.  4. Compression anklet dispensed.  5. Return to clinic in 4 weeks.     Felecia Shelling, DPM [facility]  Dr. Felecia Shelling, DPM    [address]                                        [place], [place] [address]                Office [phone]  Fax [phone]

## 2018-04-10 ENCOUNTER — Encounter: Payer: Self-pay | Admitting: Podiatry

## 2018-04-10 ENCOUNTER — Ambulatory Visit (INDEPENDENT_AMBULATORY_CARE_PROVIDER_SITE_OTHER): Payer: Self-pay | Admitting: Podiatry

## 2018-04-10 ENCOUNTER — Other Ambulatory Visit: Payer: Self-pay | Admitting: Podiatry

## 2018-04-10 ENCOUNTER — Ambulatory Visit (INDEPENDENT_AMBULATORY_CARE_PROVIDER_SITE_OTHER): Payer: BLUE CROSS/BLUE SHIELD

## 2018-04-10 DIAGNOSIS — M2041 Other hammer toe(s) (acquired), right foot: Secondary | ICD-10-CM

## 2018-04-10 DIAGNOSIS — M2042 Other hammer toe(s) (acquired), left foot: Secondary | ICD-10-CM

## 2018-04-10 DIAGNOSIS — Z9889 Other specified postprocedural states: Secondary | ICD-10-CM

## 2018-04-10 DIAGNOSIS — M21622 Bunionette of left foot: Secondary | ICD-10-CM

## 2018-04-12 NOTE — Progress Notes (Signed)
   Subjective:  Patient presents today status post right 5th toe arthroplasty and left Tailor's bunionectomy. DOS: 02/23/18. She reports continued pain to the lateral left foot. She believes the pain is coming from calluses or plantar warts. She has been seen by Dr. Elijah Birkom about this same complaint. She denies any recent treatment. There are no modifying factors noted. Patient is here for further evaluation and treatment.    History reviewed. No pertinent past medical history.    Objective/Physical Exam Neurovascular status intact. Skin incisions appear to be well coapted. No sign of infectious process noted. No dehiscence. No active bleeding noted. Pain with palpation over surgical sites with moderate edema noted to the surgical extremity.  Radiographic Exam:  Orthopedic hardware and osteotomies sites appear to be stable with routine healing.  Assessment: 1. s/p right 5th toe arthroplasty and left Tailor's bunionectomy. DOS: 02/23/18.    Plan of Care:  1. Patient was evaluated. X-Rays reviewed.  2. Continue wearing good supportive shoes.  3. Continue using compression anklets.  4. Return to clinic in 6 weeks.      Felecia ShellingBrent M. Augustine Brannick, DPM [facility]  Dr. Felecia ShellingBrent M. Joanne Salah, DPM    [address]                                        [place], [place] [address]                Office [phone]  Fax [phone]

## 2018-04-17 NOTE — Progress Notes (Signed)
DOS 02/23/2018  Tailor's bunionectomy with metatarsal osteotomy left. Hammertoe repair fifth digit right foot.

## 2018-05-22 ENCOUNTER — Encounter: Payer: BLUE CROSS/BLUE SHIELD | Admitting: Podiatry

## 2018-06-04 NOTE — Progress Notes (Signed)
This encounter was created in error - please disregard.

## 2018-06-26 DIAGNOSIS — Z8041 Family history of malignant neoplasm of ovary: Secondary | ICD-10-CM | POA: Diagnosis not present

## 2018-06-26 DIAGNOSIS — R102 Pelvic and perineal pain: Secondary | ICD-10-CM | POA: Diagnosis not present

## 2018-06-26 DIAGNOSIS — Z975 Presence of (intrauterine) contraceptive device: Secondary | ICD-10-CM | POA: Diagnosis not present

## 2018-06-26 DIAGNOSIS — Z01411 Encounter for gynecological examination (general) (routine) with abnormal findings: Secondary | ICD-10-CM | POA: Diagnosis not present

## 2018-06-26 DIAGNOSIS — N926 Irregular menstruation, unspecified: Secondary | ICD-10-CM | POA: Diagnosis not present

## 2018-06-26 DIAGNOSIS — T8332XA Displacement of intrauterine contraceptive device, initial encounter: Secondary | ICD-10-CM | POA: Diagnosis not present

## 2018-06-29 DIAGNOSIS — B977 Papillomavirus as the cause of diseases classified elsewhere: Secondary | ICD-10-CM | POA: Insufficient documentation

## 2018-07-19 DIAGNOSIS — R102 Pelvic and perineal pain: Secondary | ICD-10-CM | POA: Diagnosis not present

## 2018-07-19 DIAGNOSIS — B977 Papillomavirus as the cause of diseases classified elsewhere: Secondary | ICD-10-CM | POA: Diagnosis not present

## 2018-07-19 DIAGNOSIS — Z30013 Encounter for initial prescription of injectable contraceptive: Secondary | ICD-10-CM | POA: Diagnosis not present

## 2018-08-04 DIAGNOSIS — S46812A Strain of other muscles, fascia and tendons at shoulder and upper arm level, left arm, initial encounter: Secondary | ICD-10-CM | POA: Diagnosis not present

## 2018-08-04 DIAGNOSIS — S29019A Strain of muscle and tendon of unspecified wall of thorax, initial encounter: Secondary | ICD-10-CM | POA: Diagnosis not present

## 2018-08-04 DIAGNOSIS — F17200 Nicotine dependence, unspecified, uncomplicated: Secondary | ICD-10-CM | POA: Diagnosis not present

## 2018-08-04 DIAGNOSIS — R1012 Left upper quadrant pain: Secondary | ICD-10-CM | POA: Diagnosis not present

## 2018-08-04 DIAGNOSIS — M791 Myalgia, unspecified site: Secondary | ICD-10-CM | POA: Diagnosis not present

## 2018-08-16 DIAGNOSIS — B977 Papillomavirus as the cause of diseases classified elsewhere: Secondary | ICD-10-CM | POA: Diagnosis not present

## 2018-08-16 DIAGNOSIS — R102 Pelvic and perineal pain: Secondary | ICD-10-CM | POA: Diagnosis not present

## 2018-10-05 DIAGNOSIS — Z3042 Encounter for surveillance of injectable contraceptive: Secondary | ICD-10-CM | POA: Diagnosis not present

## 2018-12-21 DIAGNOSIS — Z3042 Encounter for surveillance of injectable contraceptive: Secondary | ICD-10-CM | POA: Diagnosis not present

## 2019-01-30 DIAGNOSIS — F1911 Other psychoactive substance abuse, in remission: Secondary | ICD-10-CM | POA: Diagnosis not present

## 2019-01-30 DIAGNOSIS — Z23 Encounter for immunization: Secondary | ICD-10-CM | POA: Diagnosis not present

## 2019-01-30 DIAGNOSIS — R1030 Lower abdominal pain, unspecified: Secondary | ICD-10-CM | POA: Diagnosis not present

## 2019-02-05 DIAGNOSIS — R1030 Lower abdominal pain, unspecified: Secondary | ICD-10-CM | POA: Diagnosis not present

## 2019-02-20 DIAGNOSIS — R1030 Lower abdominal pain, unspecified: Secondary | ICD-10-CM | POA: Diagnosis not present

## 2019-04-18 ENCOUNTER — Other Ambulatory Visit: Payer: BLUE CROSS/BLUE SHIELD

## 2019-08-28 HISTORY — PX: COLONOSCOPY: SHX174

## 2019-12-02 DIAGNOSIS — Z8742 Personal history of other diseases of the female genital tract: Secondary | ICD-10-CM

## 2019-12-02 DIAGNOSIS — Z8619 Personal history of other infectious and parasitic diseases: Secondary | ICD-10-CM

## 2019-12-02 HISTORY — DX: Personal history of other infectious and parasitic diseases: Z86.19

## 2019-12-02 HISTORY — DX: Personal history of other diseases of the female genital tract: Z87.42

## 2020-04-28 ENCOUNTER — Other Ambulatory Visit: Payer: Self-pay

## 2020-04-28 ENCOUNTER — Ambulatory Visit
Admission: EM | Admit: 2020-04-28 | Discharge: 2020-04-28 | Disposition: A | Discharge status: Home / Self Care | Payer: BLUE CROSS/BLUE SHIELD | Attending: Emergency Medicine | Admitting: Emergency Medicine

## 2020-04-28 DIAGNOSIS — R519 Headache, unspecified: Secondary | ICD-10-CM

## 2020-04-28 DIAGNOSIS — R0981 Nasal congestion: Secondary | ICD-10-CM

## 2020-04-28 MED ORDER — KETOROLAC TROMETHAMINE 30 MG/ML IJ SOLN
30.0000 mg | Freq: Once | INTRAMUSCULAR | Status: AC
  Administered 2020-04-28: 30 mg via INTRAMUSCULAR

## 2020-04-28 MED ORDER — ONDANSETRON 4 MG PO TBDP
4.0000 mg | ORAL_TABLET | Freq: Once | ORAL | Status: AC
  Administered 2020-04-28: 4 mg via ORAL

## 2020-04-28 MED ORDER — DEXAMETHASONE SODIUM PHOSPHATE 10 MG/ML IJ SOLN
10.0000 mg | Freq: Once | INTRAMUSCULAR | Status: AC
  Administered 2020-04-28: 10 mg via INTRAMUSCULAR

## 2020-04-28 NOTE — ED Triage Notes (Signed)
Patient states that she has had left ear pain and pressure since yesterday as well as sinus pain behind her left eye. Pt is aox4 and ambulatory.

## 2020-04-28 NOTE — ED Provider Notes (Signed)
[facility]    CSN: [id number] Arrival date & time: 04/28/20  1726      History   Chief Complaint Chief Complaint  Patient presents with  . Otalgia    Left ear since yesterday    HPI Alison Hamilton is a 39 y.o. female  Presenting for Covid testing.  Endorsing left-sided headache with photophobia, phonophobia.  Endorsing history thereof.  Denies anticoagulant/blood thinner use, recent head trauma, fever.  Also endorsing sinus congestion, left otalgia.  No ear discharge or bleeding.  Has taken Tylenol without relief.  History reviewed. No pertinent past medical history.  Patient Active Problem List   Diagnosis Date Noted  . Smoker 03/29/2017  . History of substance abuse (HCC) 02/17/2017    Past Surgical History:  Procedure Laterality Date  . WISDOM TOOTH EXTRACTION      OB History   No obstetric history on file.      Home Medications    Prior to Admission medications   Medication Sig Start Date End Date Taking? Authorizing Provider  ibuprofen (ADVIL,MOTRIN) 600 MG tablet Take 1 tablet (600 mg total) by mouth every 8 (eight) hours as needed. 02/23/18  Yes Felecia Shelling, DPM    Family History History reviewed. No pertinent family history.  Social History Social History   Tobacco Use  . Smoking status: Current Every Day Smoker    Packs/day: 1.00    Years: 50.00    Pack years: 50.00    Start date: 05/03/1985  . Smokeless tobacco: Never Used  Vaping Use  . Vaping Use: Never used  Substance Use Topics  . Alcohol use: No    Comment: hx of ETOH abuse - none since 2011  . Drug use: No    Comment: history of crack and ETOH abuse     Allergies   Patient has no known allergies.   Review of Systems Review of Systems  Constitutional: Negative for fatigue and fever.  HENT: Positive for congestion, ear pain and sinus pressure. Negative for dental problem, facial swelling, hearing loss, sinus pain, sore throat, trouble swallowing and voice  change.   Eyes: Positive for photophobia. Negative for pain and visual disturbance.  Respiratory: Negative for cough and shortness of breath.   Cardiovascular: Negative for chest pain and palpitations.  Gastrointestinal: Negative for diarrhea and vomiting.  Musculoskeletal: Negative for arthralgias and myalgias.  Neurological: Positive for headaches. Negative for dizziness.     Physical Exam Triage Vital Signs ED Triage Vitals  Enc Vitals Group     BP 04/28/20 1842 (!) 156/90     Pulse Rate 04/28/20 1842 90     Resp 04/28/20 1842 20     Temp 04/28/20 1842 98.7 F (37.1 C)     Temp Source 04/28/20 1842 Oral     SpO2 04/28/20 1842 90 %     Weight --      Height --      Head Circumference --      Peak Flow --      Pain Score 04/28/20 1848 8     Pain Loc --      Pain Edu? --      Excl. in GC? --    No data found.  Updated Vital Signs BP (!) 156/90 (BP Location: Right Arm)   Pulse 90   Temp 98.7 F (37.1 C) (Oral)   Resp 20   LMP 04/24/2020 (Approximate)   SpO2 90%   Visual Acuity Right Eye Distance:  Left Eye Distance:   Bilateral Distance:    Right Eye Near:   Left Eye Near:    Bilateral Near:     Physical Exam Constitutional:      General: She is not in acute distress.    Appearance: She is not ill-appearing or diaphoretic.  HENT:     Head: Normocephalic and atraumatic.     Right Ear: Tympanic membrane and ear canal normal.     Left Ear: Tympanic membrane and ear canal normal.     Mouth/Throat:     Mouth: Mucous membranes are moist.     Pharynx: Oropharynx is clear. No oropharyngeal exudate or posterior oropharyngeal erythema.  Eyes:     General: No scleral icterus.    Conjunctiva/sclera: Conjunctivae normal.     Pupils: Pupils are equal, round, and reactive to light.  Neck:     Comments: Trachea midline, negative JVD Cardiovascular:     Rate and Rhythm: Normal rate and regular rhythm.     Heart sounds: No murmur heard. No gallop.   Pulmonary:      Effort: Pulmonary effort is normal. No respiratory distress.     Breath sounds: No wheezing, rhonchi or rales.  Musculoskeletal:     Cervical back: Neck supple. No tenderness.  Lymphadenopathy:     Cervical: No cervical adenopathy.  Skin:    Capillary Refill: Capillary refill takes less than 2 seconds.     Coloration: Skin is not jaundiced or pale.     Findings: No rash.  Neurological:     General: No focal deficit present.     Mental Status: She is alert and oriented to person, place, and time.      UC Treatments / Results  Labs (all labs ordered are listed, but only abnormal results are displayed) Labs Reviewed  NOVEL CORONAVIRUS, NAA    EKG   Radiology No results found.  Procedures Procedures (including critical care time)  Medications Ordered in UC Medications  ketorolac (TORADOL) 30 MG/ML injection 30 mg (30 mg Intramuscular Given 04/28/20 1959)  dexamethasone (DECADRON) injection 10 mg (10 mg Intramuscular Given 04/28/20 1959)  ondansetron (ZOFRAN-ODT) disintegrating tablet 4 mg (4 mg Oral Given 04/28/20 1959)    Initial Impression / Assessment and Plan / UC Course  I have reviewed the triage vital signs and the nursing notes.  Pertinent labs & imaging results that were available during my care of the patient were reviewed by me and considered in my medical decision making (see chart for details).     Patient afebrile, nontoxic, with SpO2 90%.  Patient is without neurocognitive deficit on exam: Headache cocktail administered in office which he tolerated well.  Covid PCR pending.  Patient to quarantine until results are back.  We will treat supportively as outlined below.  ER return precautions discussed, patient verbalized understanding and is agreeable to plan. Final Clinical Impressions(s) / UC Diagnoses   Final diagnoses:  Bad headache  Nasal congestion   Discharge Instructions   None    ED Prescriptions    None     PDMP not reviewed this  encounter.   Hall[place]Potvin, [place], [place] 04/28/20 2002

## 2020-04-29 LAB — NOVEL CORONAVIRUS, NAA: SARS-CoV-2, NAA: NOT DETECTED

## 2020-04-29 LAB — SARS-COV-2, NAA 2 DAY TAT

## 2020-05-03 NOTE — L&D Delivery Note (Signed)
Delivery Note  Complete dilation at 8:30 PM  Onset of pushing at 2044 FHR second stage Cat 2 Analgesia/Anesthesia intrapartum: Epidural  Guided pushing with maternal urge. Delivery of a viable female "T[place]'lin" at 2052. Fetal head delivered in LOA position.  Nuchal cord: N/A.  Infant placed on maternal abd, dried, and tactile stim. Spontaneous cry. Cord double clamped after pulsation ceased and cut by FOB.  2 Rns present for birth.  Cord blood sample collected: yes Arterial cord blood sample collected: N/A  Placenta delivered Tomasa Blase via Binnie Kand Maneuver, intact, with 3 VC.  AMTSL Placenta to L&D. Uterine tone firm U/2, bleeding minimal  No laceration identified.  Anesthesia: epidural Repair: N/A QBL/EBL (mL): 100 Complications: N/A APGAR: APGAR (1 MIN): 9   APGAR (5 MINS): 9   APGAR (10 MINS):   Mom to postpartum.  Baby to Couplet care / Skin to Skin.  Gerhard Munch Lakhia Gengler MSN, CNM 04/05/2021, 9:09 PM

## 2020-07-07 ENCOUNTER — Other Ambulatory Visit: Payer: Self-pay | Admitting: Physician Assistant

## 2020-07-07 ENCOUNTER — Other Ambulatory Visit: Payer: Self-pay | Admitting: Pain Medicine

## 2020-07-07 DIAGNOSIS — Z1231 Encounter for screening mammogram for malignant neoplasm of breast: Secondary | ICD-10-CM

## 2020-07-18 ENCOUNTER — Other Ambulatory Visit: Payer: Self-pay

## 2020-07-18 ENCOUNTER — Encounter (HOSPITAL_COMMUNITY): Payer: Self-pay

## 2020-07-18 ENCOUNTER — Emergency Department (HOSPITAL_COMMUNITY)
Admission: EM | Admit: 2020-07-18 | Discharge: 2020-07-18 | Disposition: A | Discharge status: Home / Self Care | Payer: Medicaid Other | Attending: Emergency Medicine | Admitting: Emergency Medicine

## 2020-07-18 DIAGNOSIS — J029 Acute pharyngitis, unspecified: Secondary | ICD-10-CM | POA: Insufficient documentation

## 2020-07-18 DIAGNOSIS — F17200 Nicotine dependence, unspecified, uncomplicated: Secondary | ICD-10-CM | POA: Diagnosis not present

## 2020-07-18 LAB — GROUP A STREP BY PCR: Group A Strep by PCR: NOT DETECTED

## 2020-07-18 MED ORDER — OXYMETAZOLINE HCL 0.05 % NA SOLN
2.0000 | Freq: Two times a day (BID) | NASAL | Status: DC | PRN
  Administered 2020-07-18: 2 via NASAL
  Filled 2020-07-18: qty 30

## 2020-07-18 MED ORDER — DEXAMETHASONE SODIUM PHOSPHATE 10 MG/ML IJ SOLN
10.0000 mg | Freq: Once | INTRAMUSCULAR | Status: AC
  Administered 2020-07-18: 10 mg via INTRAMUSCULAR
  Filled 2020-07-18: qty 1

## 2020-07-18 NOTE — ED Notes (Signed)
Lab called to check on status of strep swab, states they have the swab and are running it now.

## 2020-07-18 NOTE — ED Triage Notes (Signed)
Pt sts sore throat x 2 days radiating to left ear.

## 2020-07-18 NOTE — ED Provider Notes (Signed)
[facility]-EMERGENCY DEPT Provider Note: Lowella Dell, MD, FACEP  CSN: [id number] MRN: [id number] ARRIVAL: 07/18/20 at 0018 ROOM: [facility]05/[facility]05   CHIEF COMPLAINT  Sore Throat   HISTORY OF PRESENT ILLNESS  07/18/20 1:14 AM Alison Hamilton is a 40 y.o. female with 2 days of sore throat, nasal congestion, sinus pressure and productive cough.  Her throat pain is moderate and worse with swallowing.  She has taken Tylenol without adequate relief.  She has also been taking an over-the-counter cold preparation without relief.  She is not aware of having a fever.  She took a home Covid test which was negative.   History reviewed. No pertinent past medical history.  Past Surgical History:  Procedure Laterality Date  . WISDOM TOOTH EXTRACTION      No family history on file.  Social History   Tobacco Use  . Smoking status: Current Every Day Smoker    Packs/day: 1.00    Years: 50.00    Pack years: 50.00    Start date: 05/03/1985  . Smokeless tobacco: Never Used  Vaping Use  . Vaping Use: Never used  Substance Use Topics  . Alcohol use: No    Comment: hx of ETOH abuse - none since 2011  . Drug use: No    Comment: history of crack and ETOH abuse    Prior to Admission medications   Not on File    Allergies Patient has no known allergies.   REVIEW OF SYSTEMS  Negative except as noted here or in the History of Present Illness.   PHYSICAL EXAMINATION  Initial Vital Signs Blood pressure (!) 156/107, pulse 83, temperature 98.9 F (37.2 C), temperature source Oral, resp. rate 17, height 5\' 7"  (1.702 m), Hamilton 79.4 kg, SpO2 100 %.  Examination General: Well-developed, well-nourished female in no acute distress; appearance consistent with age of record HENT: normocephalic; atraumatic; pharyngeal erythema without exudate; uvula midline; dysphonia Eyes: pupils equal, round and reactive to light; extraocular muscles intact Neck: supple; anterior cervical lymphadenopathy Heart: regular  rate and rhythm Lungs: clear to auscultation bilaterally Abdomen: soft; nondistended; nontender; bowel sounds present Extremities: No deformity; full range of motion; pulses normal Neurologic: Awake, alert and oriented; motor function intact in all extremities and symmetric; no facial droop Skin: Warm and dry Psychiatric: Normal mood and affect   RESULTS  Summary of this visit's results, reviewed and interpreted by myself:   EKG Interpretation  Date/Time:    Ventricular Rate:    PR Interval:    QRS Duration:   QT Interval:    QTC Calculation:   R Axis:     Text Interpretation:        Laboratory Studies: Results for orders placed or performed during the hospital encounter of 07/18/20 (from the past 24 hour(s))  Group A Strep by PCR     Status: None   Collection Time: 07/18/20  1:22 AM   Specimen: Throat; Sterile Swab  Result Value Ref Range   Group A Strep by PCR NOT DETECTED NOT DETECTED   Imaging Studies: No results found.  ED COURSE and MDM  Nursing notes, initial and subsequent vitals signs, including pulse oximetry, reviewed and interpreted by myself.  Vitals:   07/18/20 0029 07/18/20 0032  BP: (!) 156/107   Pulse: 83   Resp:  17  Temp: 98.9 F (37.2 C)   TempSrc: Oral   SpO2: 100%   Hamilton: 79.4 kg   Height: 5\' 7"  (1.702 m)    Medications  oxymetazoline (  AFRIN) 0.05 % nasal spray 2 spray (2 sprays Each Nare Given 07/18/20 0132)  dexamethasone (DECADRON) injection 10 mg (has no administration in time range)   2:27 AM The patient is negative for strep and her symptoms are more consistent with a viral illness.  She would like to try a shot of dexamethasone for symptomatic relief.   PROCEDURES  Procedures   ED DIAGNOSES     ICD-10-CM   1. Viral pharyngitis  J02.9        Champion Corales, MD 07/18/20 9163

## 2020-08-14 ENCOUNTER — Ambulatory Visit
Admission: RE | Admit: 2020-08-14 | Discharge: 2020-08-14 | Disposition: A | Discharge status: Home / Self Care | Payer: Medicaid Other | Source: Ambulatory Visit | Attending: Physician Assistant | Admitting: Physician Assistant

## 2020-08-14 ENCOUNTER — Other Ambulatory Visit: Payer: Self-pay

## 2020-08-14 DIAGNOSIS — Z1231 Encounter for screening mammogram for malignant neoplasm of breast: Secondary | ICD-10-CM

## 2020-09-17 ENCOUNTER — Inpatient Hospital Stay (HOSPITAL_COMMUNITY)
Admission: AD | Admit: 2020-09-17 | Discharge: 2020-09-17 | Disposition: A | Discharge status: Home / Self Care | Payer: Medicaid Other | Attending: Obstetrics & Gynecology | Admitting: Obstetrics & Gynecology

## 2020-09-17 ENCOUNTER — Other Ambulatory Visit: Payer: Self-pay

## 2020-09-17 ENCOUNTER — Encounter (HOSPITAL_COMMUNITY): Payer: Self-pay | Admitting: Obstetrics & Gynecology

## 2020-09-17 ENCOUNTER — Ambulatory Visit
Admission: EM | Admit: 2020-09-17 | Discharge: 2020-09-17 | Discharge status: Absent without leave | Payer: Medicaid Other

## 2020-09-17 DIAGNOSIS — F17210 Nicotine dependence, cigarettes, uncomplicated: Secondary | ICD-10-CM | POA: Diagnosis not present

## 2020-09-17 DIAGNOSIS — O99891 Other specified diseases and conditions complicating pregnancy: Secondary | ICD-10-CM | POA: Diagnosis not present

## 2020-09-17 DIAGNOSIS — O99331 Smoking (tobacco) complicating pregnancy, first trimester: Secondary | ICD-10-CM | POA: Diagnosis not present

## 2020-09-17 DIAGNOSIS — O368310 Maternal care for abnormalities of the fetal heart rate or rhythm, first trimester, not applicable or unspecified: Secondary | ICD-10-CM | POA: Diagnosis not present

## 2020-09-17 DIAGNOSIS — O26891 Other specified pregnancy related conditions, first trimester: Secondary | ICD-10-CM | POA: Insufficient documentation

## 2020-09-17 DIAGNOSIS — O368390 Maternal care for abnormalities of the fetal heart rate or rhythm, unspecified trimester, not applicable or unspecified: Secondary | ICD-10-CM

## 2020-09-17 DIAGNOSIS — Z3A13 13 weeks gestation of pregnancy: Secondary | ICD-10-CM

## 2020-09-17 DIAGNOSIS — R519 Headache, unspecified: Secondary | ICD-10-CM

## 2020-09-17 HISTORY — DX: Unspecified abnormal cytological findings in specimens from cervix uteri: R87.619

## 2020-09-17 MED ORDER — DEXAMETHASONE SODIUM PHOSPHATE 10 MG/ML IJ SOLN
10.0000 mg | Freq: Once | INTRAMUSCULAR | Status: AC
  Administered 2020-09-17: 10 mg via INTRAVENOUS
  Filled 2020-09-17: qty 1

## 2020-09-17 MED ORDER — METOCLOPRAMIDE HCL 5 MG/ML IJ SOLN
10.0000 mg | Freq: Once | INTRAMUSCULAR | Status: AC
  Administered 2020-09-17: 10 mg via INTRAVENOUS
  Filled 2020-09-17: qty 2

## 2020-09-17 MED ORDER — DIPHENHYDRAMINE HCL 50 MG/ML IJ SOLN
25.0000 mg | Freq: Once | INTRAMUSCULAR | Status: AC
  Administered 2020-09-17: 25 mg via INTRAVENOUS
  Filled 2020-09-17: qty 1

## 2020-09-17 MED ORDER — CYCLOBENZAPRINE HCL 5 MG PO TABS: 5.0000 mg | ORAL_TABLET | Freq: Three times a day (TID) | ORAL | 0 refills | Status: DC | PRN

## 2020-09-17 MED ORDER — LACTATED RINGERS IV BOLUS
1000.0000 mL | Freq: Once | INTRAVENOUS | Status: AC
  Administered 2020-09-17: 1000 mL via INTRAVENOUS

## 2020-09-17 NOTE — MAU Provider Note (Signed)
History     CSN: [id number]  Arrival date and time: 09/17/20 1032   Event Date/Time   First Provider Initiated Contact with Patient 09/17/20 1128      Chief Complaint  Patient presents with  . Headache   HPI Alison Hamilton is a 40 y.o. G3P2002 at [redacted]w[redacted]d by LMP who presents with headache. Reports headache since yesterday morning. Gradual onset. Throbbing pain in left temporal area. Pain is worse with light. Rates 10/10. Took 2 ES tylenol yesterday without relief. Does have history of headaches like this. In the past has gone to urgent care & given a headache cocktail. Endorses some nausea but states that's been ongoing with the pregnancy. Denies fever, dental pain, neck pain, head trauma. Denies abdominal pain or vaginal bleeding. Has initial appointment with [facility] in 2 weeks; has not been seen there yet.   OB History    Gravida  3   Para  2   Term  2   Preterm      AB      Living  2     SAB      IAB      Ectopic      Multiple      Live Births  2           Past Medical History:  Diagnosis Date  . Abnormal Pap smear of cervix     Past Surgical History:  Procedure Laterality Date  . FOOT SURGERY     patient states she had broken bones in both of her feet  . WISDOM TOOTH EXTRACTION      History reviewed. No pertinent family history.  Social History   Tobacco Use  . Smoking status: Current Every Day Smoker    Packs/day: 1.00    Years: 50.00    Pack years: 50.00    Start date: 05/03/1985  . Smokeless tobacco: Never Used  Vaping Use  . Vaping Use: Never used  Substance Use Topics  . Alcohol use: No    Comment: hx of ETOH abuse - none since 2011  . Drug use: No    Comment: history of crack and ETOH abuse    Allergies: No Known Allergies  No medications prior to admission.    Review of Systems  Constitutional: Negative.   HENT: Negative.   Eyes: Positive for photophobia.  Gastrointestinal: Positive for nausea. Negative for abdominal pain  and vomiting.  Genitourinary: Negative.   Neurological: Positive for headaches.   Physical Exam   Blood pressure 124/86, pulse 78, temperature 98.2 F (36.8 C), temperature source Oral, resp. rate 18, height 5\' 5"  (1.651 m), weight 74.8 kg, last menstrual period 06/16/2020, SpO2 98 %.  Physical Exam Vitals and nursing note reviewed.  Constitutional:      General: She is not in acute distress.    Appearance: She is well-developed.  HENT:     Head: Normocephalic and atraumatic.  Eyes:     Pupils: Pupils are equal, round, and reactive to light.  Pulmonary:     Effort: Pulmonary effort is normal. No respiratory distress.  Abdominal:     General: There is no distension.     Palpations: Abdomen is soft.     Tenderness: There is no abdominal tenderness.  Skin:    General: Skin is warm and dry.  Neurological:     Mental Status: She is alert and oriented to person, place, and time.     Cranial Nerves: No facial asymmetry.  Motor: No weakness or tremor.  Psychiatric:        Mood and Affect: Mood normal.        Behavior: Behavior normal.     MAU Course  Procedures No results found for this or any previous visit (from the past 24 hour(s)).  MDM Patient reports being ~ 13 wks by LMP but has history of irregular cycles. Denies abdominal pain or vaginal bleeding. RN unable to doppler FHTs. Informal bedside ultrasound performed for viability.   Pt informed that the ultrasound is considered a limited OB ultrasound and is not intended to be a complete ultrasound exam.  Patient also informed that the ultrasound is not being completed with the intent of assessing for fetal or placental anomalies or any pelvic abnormalities.  Explained that the purpose of today's ultrasound is to assess for  viability.  Patient acknowledges the purpose of the exam and the limitations of the study.  Active fetus with FHR 171 bpm. By CRL is [redacted]w[redacted]d. Will not update EDD until official ultrasound with her ob/gyn.    Patient presents with severe headache. Consistent with previous headaches. Given options for treatment in MAU including oral medications or IV. Patient prefers IV treatment & is given IV LR bolus, reglan 10 mg, benadryl 25 mg, & decadron 10 mg. Patient reports complete resolution of headache after medications.   Assessment and Plan   1. Acute nonintractable headache, unspecified headache type  -rx flexeril to take with tylenol for future headaches  2. Unable to hear fetal heart tones as reason for ultrasound scan  -viable IUP on ultrasound  3. [redacted] weeks gestation of pregnancy      Alison Hamilton 09/17/2020, 4:20 PM

## 2020-09-17 NOTE — Discharge Instructions (Signed)
Safe Medications in Pregnancy   Acne: Benzoyl Peroxide Salicylic Acid  Backache/Headache: Tylenol: 2 regular strength every 4 hours OR              2 Extra strength every 6 hours  Colds/Coughs/Allergies: Benadryl (alcohol free) 25 mg every 6 hours as needed Breath right strips Claritin Cepacol throat lozenges Chloraseptic throat spray Cold-Eeze- up to three times per day Cough drops, alcohol free Flonase (by prescription only) Guaifenesin Mucinex Robitussin DM (plain only, alcohol free) Saline nasal spray/drops Sudafed (pseudoephedrine) & Actifed ** use only after [redacted] weeks gestation and if you do not have high blood pressure Tylenol Vicks Vaporub Zinc lozenges Zyrtec   Constipation: Colace Ducolax suppositories Fleet enema Glycerin suppositories Metamucil Milk of magnesia Miralax Senokot Smooth move tea  Diarrhea: Kaopectate Imodium A-D  *NO pepto Bismol  Hemorrhoids: Anusol Anusol HC Preparation H Tucks  Indigestion: Tums Maalox Mylanta Zantac  Pepcid  Insomnia: Benadryl (alcohol free) 25mg  every 6 hours as needed Tylenol PM Unisom, no Gelcaps  Leg Cramps: Tums MagGel  Nausea/Vomiting:  Bonine Dramamine Emetrol Ginger extract Sea bands Meclizine  Nausea medication to take during pregnancy:  Unisom (doxylamine succinate 25 mg tablets) Take one tablet daily at bedtime. If symptoms are not adequately controlled, the dose can be increased to a maximum recommended dose of two tablets daily (1/2 tablet in the morning, 1/2 tablet mid-afternoon and one at bedtime). Vitamin B6 100mg  tablets. Take one tablet twice a day (up to 200 mg per day).  Skin Rashes: Aveeno products Benadryl cream or 25mg  every 6 hours as needed Calamine Lotion 1% cortisone cream  Yeast infection: Gyne-lotrimin 7 Monistat 7  Gum/tooth pain: Anbesol  **If taking multiple medications, please check labels to avoid duplicating the same active ingredients **take  medication as directed on the label ** Do not exceed 4000 mg of tylenol in 24 hours **Do not take medications that contain aspirin or ibuprofen     General Headache Without Cause A headache is pain or discomfort felt around the head or neck area. The specific cause of a headache may not be found. There are many causes and types of headaches. A few common ones are:  Tension headaches.  Migraine headaches.  Cluster headaches.  Chronic daily headaches. Follow these instructions at home: Watch your condition for any changes. Let your health care provider know about them. Take these steps to help with your condition: Managing pain  Take over-the-counter and prescription medicines only as told by your health care provider.  Lie down in a dark, quiet room when you have a headache.  If directed, put ice on your head and neck area: ? Put ice in a plastic bag. ? Place a towel between your skin and the bag. ? Leave the ice on for 20 minutes, 2-3 times per day.  If directed, apply heat to the affected area. Use the heat source that your health care provider recommends, such as a moist heat pack or a heating pad. ? Place a towel between your skin and the heat source. ? Leave the heat on for 20-30 minutes. ? Remove the heat if your skin turns bright red. This is especially important if you are unable to feel pain, heat, or cold. You may have a greater risk of getting burned.  Keep lights dim if bright lights bother you or make your headaches worse.      Eating and drinking  Eat meals on a regular schedule.  If you drink alcohol: ?  Limit how much you use to:  0-1 drink a day for women.  0-2 drinks a day for men. ? Be aware of how much alcohol is in your drink. In the [place] one drink equals one 12 oz bottle of beer (355 mL), one 5 oz glass of wine (148 mL), or one 1 oz glass of hard liquor (44 mL).  Stop drinking caffeine, or decrease the amount of caffeine you drink. General  instructions  Keep a headache journal to help find out what may trigger your headaches. For example, write down: ? What you eat and drink. ? How much sleep you get. ? Any change to your diet or medicines.  Try massage or other relaxation techniques.  Limit stress.  Sit up straight, and do not tense your muscles.  Do not use any products that contain nicotine or tobacco, such as cigarettes, e-cigarettes, and chewing tobacco. If you need help quitting, ask your health care provider.  Exercise regularly as told by your health care provider.  Sleep on a regular schedule. Get 7-9 hours of sleep each night, or the amount recommended by your health care provider.  Keep all follow-up visits as told by your health care provider. This is important.   Contact a health care provider if:  Your symptoms are not helped by medicine.  You have a headache that is different from the usual headache.  You have nausea or you vomit.  You have a fever. Get help right away if:  Your headache becomes severe quickly.  Your headache gets worse after moderate to intense physical activity.  You have repeated vomiting.  You have a stiff neck.  You have a loss of vision.  You have problems with speech.  You have pain in the eye or ear.  You have muscular weakness or loss of muscle control.  You lose your balance or have trouble walking.  You feel faint or pass out.  You have confusion.  You have a seizure. Summary  A headache is pain or discomfort felt around the head or neck area.  There are many causes and types of headaches. In some cases, the cause may not be found.  Keep a headache journal to help find out what may trigger your headaches. Watch your condition for any changes. Let your health care provider know about them.  Contact a health care provider if you have a headache that is different from the usual headache, or if your symptoms are not helped by medicine.  Get help  right away if your headache becomes severe, you vomit, you have a loss of vision, you lose your balance, or you have a seizure. This information is not intended to replace advice given to you by your health care provider. Make sure you discuss any questions you have with your health care provider. Document Revised: 11/07/2017 Document Reviewed: 11/07/2017 Elsevier Patient Education  2021 [employer].

## 2020-09-17 NOTE — MAU Note (Signed)
HA started yesterday morning.  Took 2 ES Tylenol once yesterday, did not help.  HA has intensified.  Was unsure what to do or take because of the preg.  Has to gone to UC in the past for HA. Light sensitive.

## 2020-10-07 LAB — OB RESULTS CONSOLE GC/CHLAMYDIA: Gonorrhea: NEGATIVE

## 2020-10-07 LAB — HEPATITIS C ANTIBODY: HCV Ab: NEGATIVE

## 2020-10-07 LAB — OB RESULTS CONSOLE RPR: RPR: NONREACTIVE

## 2020-10-09 LAB — OB RESULTS CONSOLE HEPATITIS B SURFACE ANTIGEN: Hepatitis B Surface Ag: NEGATIVE

## 2020-10-09 LAB — OB RESULTS CONSOLE RUBELLA ANTIBODY, IGM: Rubella: IMMUNE

## 2020-10-10 LAB — OB RESULTS CONSOLE GC/CHLAMYDIA: Gonorrhea: NEGATIVE

## 2021-01-01 ENCOUNTER — Encounter (HOSPITAL_COMMUNITY): Payer: Self-pay | Admitting: Obstetrics and Gynecology

## 2021-01-01 ENCOUNTER — Inpatient Hospital Stay (HOSPITAL_COMMUNITY)
Admission: AD | Admit: 2021-01-01 | Discharge: 2021-01-01 | Disposition: A | Discharge status: Home / Self Care | Payer: Medicaid Other | Attending: Obstetrics and Gynecology | Admitting: Obstetrics and Gynecology

## 2021-01-01 ENCOUNTER — Other Ambulatory Visit: Payer: Self-pay

## 2021-01-01 DIAGNOSIS — O98513 Other viral diseases complicating pregnancy, third trimester: Secondary | ICD-10-CM | POA: Diagnosis not present

## 2021-01-01 DIAGNOSIS — O09523 Supervision of elderly multigravida, third trimester: Secondary | ICD-10-CM | POA: Diagnosis not present

## 2021-01-01 DIAGNOSIS — U071 COVID-19: Secondary | ICD-10-CM

## 2021-01-01 DIAGNOSIS — O26893 Other specified pregnancy related conditions, third trimester: Secondary | ICD-10-CM | POA: Diagnosis not present

## 2021-01-01 DIAGNOSIS — F17210 Nicotine dependence, cigarettes, uncomplicated: Secondary | ICD-10-CM | POA: Diagnosis not present

## 2021-01-01 DIAGNOSIS — R519 Headache, unspecified: Secondary | ICD-10-CM | POA: Diagnosis not present

## 2021-01-01 DIAGNOSIS — Z3A28 28 weeks gestation of pregnancy: Secondary | ICD-10-CM

## 2021-01-01 DIAGNOSIS — O99333 Smoking (tobacco) complicating pregnancy, third trimester: Secondary | ICD-10-CM | POA: Diagnosis not present

## 2021-01-01 LAB — CBC WITH DIFFERENTIAL/PLATELET
Abs Immature Granulocytes: 0.07 10*3/uL (ref 0.00–0.07)
Basophils Absolute: 0 10*3/uL (ref 0.0–0.1)
Basophils Relative: 0 %
Eosinophils Absolute: 0.1 10*3/uL (ref 0.0–0.5)
Eosinophils Relative: 1 %
HCT: 28.5 % — ABNORMAL LOW (ref 36.0–46.0)
Hemoglobin: 9.9 g/dL — ABNORMAL LOW (ref 12.0–15.0)
Immature Granulocytes: 1 %
Lymphocytes Relative: 12 %
Lymphs Abs: 0.9 10*3/uL (ref 0.7–4.0)
MCH: 31.8 pg (ref 26.0–34.0)
MCHC: 34.7 g/dL (ref 30.0–36.0)
MCV: 91.6 fL (ref 80.0–100.0)
Monocytes Absolute: 1.4 10*3/uL — ABNORMAL HIGH (ref 0.1–1.0)
Monocytes Relative: 17 %
Neutro Abs: 5.4 10*3/uL (ref 1.7–7.7)
Neutrophils Relative %: 69 %
Platelets: 194 10*3/uL (ref 150–400)
RBC: 3.11 MIL/uL — ABNORMAL LOW (ref 3.87–5.11)
RDW: 11.9 % (ref 11.5–15.5)
WBC: 7.8 10*3/uL (ref 4.0–10.5)
nRBC: 0 % (ref 0.0–0.2)

## 2021-01-01 LAB — BASIC METABOLIC PANEL
Anion gap: 7 (ref 5–15)
BUN: <5 mg/dL — ABNORMAL LOW (ref 6–20)
CO2: 20 mmol/L — ABNORMAL LOW (ref 22–32)
Calcium: 8.8 mg/dL — ABNORMAL LOW (ref 8.9–10.3)
Chloride: 107 mmol/L (ref 98–111)
Creatinine, Ser: 0.67 mg/dL (ref 0.44–1.00)
GFR, Estimated: >60 mL/min (ref >60)
Glucose, Bld: 88 mg/dL (ref 70–99)
Potassium: 3.6 mmol/L (ref 3.5–5.1)
Sodium: 134 mmol/L — ABNORMAL LOW (ref 135–145)

## 2021-01-01 LAB — URINALYSIS, ROUTINE W REFLEX MICROSCOPIC
Bilirubin Urine: NEGATIVE
Glucose, UA: NEGATIVE mg/dL
Ketones, ur: NEGATIVE mg/dL
Leukocytes,Ua: NEGATIVE
Nitrite: NEGATIVE
Protein, ur: NEGATIVE mg/dL
Specific Gravity, Urine: 1.011 (ref 1.005–1.030)
pH: 6 (ref 5.0–8.0)

## 2021-01-01 LAB — RESP PANEL BY RT-PCR (FLU A&B, COVID) ARPGX2
Influenza A by PCR: NEGATIVE
Influenza B by PCR: NEGATIVE
SARS Coronavirus 2 by RT PCR: POSITIVE — AB

## 2021-01-01 MED ORDER — DIPHENHYDRAMINE HCL 50 MG/ML IJ SOLN
25.0000 mg | Freq: Once | INTRAMUSCULAR | Status: AC
  Administered 2021-01-01: 25 mg via INTRAVENOUS
  Filled 2021-01-01: qty 1

## 2021-01-01 MED ORDER — METOCLOPRAMIDE HCL 5 MG/ML IJ SOLN
10.0000 mg | Freq: Once | INTRAMUSCULAR | Status: AC
  Administered 2021-01-01: 10 mg via INTRAVENOUS
  Filled 2021-01-01: qty 2

## 2021-01-01 MED ORDER — PROMETHAZINE HCL 25 MG PO TABS: 25.0000 mg | ORAL_TABLET | Freq: Four times a day (QID) | ORAL | 0 refills | Status: DC | PRN

## 2021-01-01 MED ORDER — LACTATED RINGERS IV BOLUS
1000.0000 mL | Freq: Once | INTRAVENOUS | Status: AC
  Administered 2021-01-01: 1000 mL via INTRAVENOUS

## 2021-01-01 MED ORDER — PROCHLORPERAZINE EDISYLATE 10 MG/2ML IJ SOLN
10.0000 mg | Freq: Four times a day (QID) | INTRAMUSCULAR | Status: DC | PRN
  Administered 2021-01-01: 10 mg via INTRAVENOUS
  Filled 2021-01-01 (×3): qty 2

## 2021-01-01 MED ORDER — CYCLOBENZAPRINE HCL 5 MG PO TABS: 10.0000 mg | ORAL_TABLET | Freq: Three times a day (TID) | ORAL | 2 refills | Status: AC | PRN

## 2021-01-01 NOTE — MAU Provider Note (Signed)
History     CSN: [id number]  Arrival date and time: 01/01/21 1913   Event Date/Time   First Provider Initiated Contact with Patient 01/01/21 2208      Chief Complaint  Patient presents with   Fever   Generalized Body Aches   HPI Alison Hamilton is a 40 y.o. G3P2002 at [redacted]w[redacted]d who presents to MAU for evaluation of fever, chills and body aches in the setting of a negative home COVID test. All patient's complaints are new onset yesterday, 12/31/2020. She attended her OB appointment with [facility] earlier today and was found to have an oral temp of 101. She is s/p COVID vaccine and booster x 1. She also had COVID prior to her pregnancy. No one else in her family is ill. She denies exposure to ill people.  On arrival MAU patient also complains of bilateral anterior headache. Pain score 8/10. She denies aggravating or alleviating factors. She has not taken medication or tried other treatments for this complaint. She denies vaginal bleeding, leaking of fluid, decreased fetal movement, fever, falls, or recent illness.    OB History     Gravida  3   Para  2   Term  2   Preterm      AB      Living  2      SAB      IAB      Ectopic      Multiple      Live Births  2           Past Medical History:  Diagnosis Date   Abnormal Pap smear of cervix     Past Surgical History:  Procedure Laterality Date   FOOT SURGERY     patient states she had broken bones in both of her feet   WISDOM TOOTH EXTRACTION      History reviewed. No pertinent family history.  Social History   Tobacco Use   Smoking status: Every Day    Packs/day: 1.00    Years: 50.00    Pack years: 50.00    Types: Cigarettes    Start date: 05/03/1985   Smokeless tobacco: Never  Vaping Use   Vaping Use: Never used  Substance Use Topics   Alcohol use: No    Comment: hx of ETOH abuse - none since 2011   Drug use: No    Comment: history of crack and ETOH abuse    Allergies: No Known  Allergies  Medications Prior to Admission  Medication Sig Dispense Refill Last Dose   acetaminophen (TYLENOL) 500 MG tablet Take 500 mg by mouth every 6 (six) hours as needed.   01/01/2021   Prenatal Vit-Fe Fumarate-FA (MULTIVITAMIN-PRENATAL) 27-0.8 MG TABS tablet Take 1 tablet by mouth daily at 12 noon.      [DISCONTINUED] cyclobenzaprine (FLEXERIL) 5 MG tablet Take 1 tablet (5 mg total) by mouth 3 (three) times daily as needed (take with tylenol for headaches). 20 tablet 0     Review of Systems  Constitutional:  Positive for chills, fatigue and fever.  Neurological:  Positive for headaches.  All other systems reviewed and are negative. Physical Exam   Blood pressure 110/67, pulse 98, temperature 99.4 F (37.4 C), resp. rate 19, last menstrual period 06/16/2020, SpO2 99 %.  Physical Exam Vitals and nursing note reviewed. Exam conducted with a chaperone present.  Constitutional:      Appearance: Normal appearance.  Cardiovascular:     Rate and Rhythm: Regular rhythm.  Pulses: Normal pulses.     Heart sounds: Normal heart sounds.  Pulmonary:     Effort: Pulmonary effort is normal.     Breath sounds: Normal breath sounds.  Abdominal:     Comments: Gravid  Skin:    Capillary Refill: Capillary refill takes less than 2 seconds.  Neurological:     Mental Status: She is alert and oriented to person, place, and time.  Psychiatric:        Mood and Affect: Mood normal.        Behavior: Behavior normal.        Thought Content: Thought content normal.        Judgment: Judgment normal.    MAU Course  Procedures  --Reactive tracing: baseline 150, mod var, + accels, no decels --Toco: quiet --Patient sleeping one hour after medication administration --Discussed Paxlovid, indications and data. Pt declines  Orders Placed This Encounter  Procedures   Resp Panel by RT-PCR (Flu A&B, Covid) Nasopharyngeal Swab   Urinalysis, Routine w reflex microscopic Urine, Clean Catch   CBC with  Differential/Platelet   Basic metabolic panel   Discharge patient   Patient Vitals for the past 24 hrs:  BP Temp Temp src Pulse Resp SpO2  01/01/21 2230 113/63 99.1 F (37.3 C) Oral (!) 103 18 98 %  01/01/21 2050 -- -- -- -- -- 99 %  01/01/21 2049 110/67 -- -- 98 -- --  01/01/21 2045 -- -- -- -- -- 98 %  01/01/21 1948 113/67 -- -- (!) 101 -- --  01/01/21 1946 -- 99.4 F (37.4 C) -- (!) 102 19 98 %   Results for orders placed or performed during the hospital encounter of 01/01/21 (from the past 24 hour(s))  Urinalysis, Routine w reflex microscopic Urine, Clean Catch     Status: Abnormal   Collection Time: 01/01/21  7:54 PM  Result Value Ref Range   Color, Urine YELLOW YELLOW   APPearance HAZY (A) CLEAR   Specific Gravity, Urine 1.011 1.005 - 1.030   pH 6.0 5.0 - 8.0   Glucose, UA NEGATIVE NEGATIVE mg/dL   Hgb urine dipstick SMALL (A) NEGATIVE   Bilirubin Urine NEGATIVE NEGATIVE   Ketones, ur NEGATIVE NEGATIVE mg/dL   Protein, ur NEGATIVE NEGATIVE mg/dL   Nitrite NEGATIVE NEGATIVE   Leukocytes,Ua NEGATIVE NEGATIVE   RBC / HPF 6-10 0 - 5 RBC/hpf   WBC, UA 0-5 0 - 5 WBC/hpf   Bacteria, UA RARE (A) NONE SEEN   Squamous Epithelial / LPF 0-5 0 - 5  Resp Panel by RT-PCR (Flu A&B, Covid) Nasopharyngeal Swab     Status: Abnormal   Collection Time: 01/01/21  8:09 PM   Specimen: Nasopharyngeal Swab; Nasopharyngeal(NP) swabs in vial transport medium  Result Value Ref Range   SARS Coronavirus 2 by RT PCR POSITIVE (A) NEGATIVE   Influenza A by PCR NEGATIVE NEGATIVE   Influenza B by PCR NEGATIVE NEGATIVE  CBC with Differential/Platelet     Status: Abnormal   Collection Time: 01/01/21  8:28 PM  Result Value Ref Range   WBC 7.8 4.0 - 10.5 K/uL   RBC 3.11 (L) 3.87 - 5.11 MIL/uL   Hemoglobin 9.9 (L) 12.0 - 15.0 g/dL   HCT 82.5 (L) 05.3 - 97.6 %   MCV 91.6 80.0 - 100.0 fL   MCH 31.8 26.0 - 34.0 pg   MCHC 34.7 30.0 - 36.0 g/dL   RDW 73.4 19.3 - 79.0 %   Platelets 194 150 - 400  K/uL    nRBC 0.0 0.0 - 0.2 %   Neutrophils Relative % 69 %   Neutro Abs 5.4 1.7 - 7.7 K/uL   Lymphocytes Relative 12 %   Lymphs Abs 0.9 0.7 - 4.0 K/uL   Monocytes Relative 17 %   Monocytes Absolute 1.4 (H) 0.1 - 1.0 K/uL   Eosinophils Relative 1 %   Eosinophils Absolute 0.1 0.0 - 0.5 K/uL   Basophils Relative 0 %   Basophils Absolute 0.0 0.0 - 0.1 K/uL   Immature Granulocytes 1 %   Abs Immature Granulocytes 0.07 0.00 - 0.07 K/uL  Basic metabolic panel     Status: Abnormal   Collection Time: 01/01/21  8:28 PM  Result Value Ref Range   Sodium 134 (L) 135 - 145 mmol/L   Potassium 3.6 3.5 - 5.1 mmol/L   Chloride 107 98 - 111 mmol/L   CO2 20 (L) 22 - 32 mmol/L   Glucose, Bld 88 70 - 99 mg/dL   BUN <5 (L) 6 - 20 mg/dL   Creatinine, Ser 2.26 0.44 - 1.00 mg/dL   Calcium 8.8 (L) 8.9 - 10.3 mg/dL   GFR, Estimated >33 >35 mL/min   Anion gap 7 5 - 15   Meds ordered this encounter  Medications   metoCLOPramide (REGLAN) injection 10 mg   diphenhydrAMINE (BENADRYL) injection 25 mg   DISCONTD: prochlorperazine (COMPAZINE) injection 10 mg   lactated ringers bolus 1,000 mL   cyclobenzaprine (FLEXERIL) 5 MG tablet    Sig: Take 2 tablets (10 mg total) by mouth 3 (three) times daily as needed for muscle spasms (take with tylenol for headaches).    Dispense:  20 tablet    Refill:  2    Order Specific Question:   Supervising Provider    Answer:   Jaynie Collins A [[id number]]   promethazine (PHENERGAN) 25 MG tablet    Sig: Take 1 tablet (25 mg total) by mouth every 6 (six) hours as needed for nausea or vomiting.    Dispense:  30 tablet    Refill:  0    Order Specific Question:   Supervising Provider    Answer:   Jaynie Collins A [[id number]]     Assessment and Plan  -40 y.o. G3P2002 at [redacted]w[redacted]d  --Reactive tracing --COVID positive --Denies pain at time of discharge --Discharge home in stable condition  Calvert Cantor, CNM

## 2021-01-01 NOTE — Discharge Instructions (Signed)
Coronavirus (COVID-19) and Pregnancy:  Frequently Asked Questions   How might coronavirus affect my pregnancy? The data for COVID-19 is limited, but we know that women with other coronavirus infections (such as SARS-CoV) did NOT have miscarriage or stillbirth at higher rates than the general population.  On the other hand, we know that having other respiratory viral infections during pregnancy, such as flu, has been associated with problems like low birth weight and preterm birth. Also, having a high fever early in pregnancy may increase the risk of certain birth defects.  Could I transmit coronavirus to my baby during pregnancy or delivery? Among the few case studies of infants born to mothers with COVID-19 published in peer-reviewed literature, none of the infants tested positive for the virus. And there have been no reports of mother-to-baby transmission for other coronaviruses (MERS-CoV and SARS-CoV). Also, there was no virus detected in samples of amniotic fluid or breast milk. But there have been a few reports of newborns as young as a few days old with infection, suggesting that a mother can transmit the infection to her infant through close contact after delivery.  Is it safe for me to deliver at a hospital where there have been COVID-19 cases? It should be. We know that COVID-19 is a very scary virus. The good news is that hospitals are taking great precautions to keep patients and healthcare providers safe.  According to the CDC guidelines, when a patient is even suspected to have COVID-19, they should be placed in a negative pressure room. (Think of these rooms as vacuums that suck and filter the air so it's safe for the other people in the hospital.) If there are no rooms available, these patients should be asked to wait at home until they can be accomodated safely. This should make it possible for you to deliver at the hospital without putting you or your baby at risk.  Hospitals are  also implementing stricter visiting policies to keep patients safe. It's worth calling your hospital to check if there are any new regulations to be aware of.  What plans should I make now in case the hospital system is overwhelmed when it's time for me to deliver? Every hospital is making different plans for dealing with this scenario. Talk with your doctor or midwife once you're at least [redacted] weeks pregnant. 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 Should I ask my doctor to excuse me from work until the baby is born? Should I ask my doctor to excuse me from work until the baby is born? What if I work in a school, the travel industry, or some other high-risk setting? Healthcare facilities should take care to limit the exposure of pregnant employees to patients with confirmed or suspected COVID-19, just as they would with other infectious cases. If you continue working, be sure to follow the CDC's risk assessment and infection control guidelines.  If you work in a school, travel industry, or other high-risk setting, talk with your employer about what it's doing to protect employees and minimize infection risks. Wash your hands often.   What if my OB gets COVID-19? If your doctor or midwife tests positive for COVID-19, they will need to be quarantined until they recover and are no longer at risk of transmitting the virus. In this case, you'll be assigned to another OB in your doctor's practice (or you may choose another practitioner yourself). Ask your new OB or your doctor's office if you should  self-quarantine or be tested for the virus. (It will depend on when you last saw your provider and when that person tested positive.)  Should we hold off on trying to conceive because of COVID-19? At this time, there's no reason to hold off on trying to get pregnant, but the data we have is really limited. For example, we don't think the virus causes birth defects or increases your risk of miscarriage.  But we don't know for sure whether you could transmit COVID-19 to your baby before or during delivery. We also don't know if the virus lives in semen or can be sexually transmitted.  We have a babymoon scheduled in the next few months - should we cancel? Yes. At this time, the virus has reached more than 140 countries, and there are travel bans to [place], most of [place], and [place]. Places where large numbers of people gather are at highest risk, especially airports and cruise ships.  If you were planning travel in the [place] note that any travel setting increases your risk of exposure, and there are already many places where everyone is being asked to stay home. To see how the virus is spreading, check The [employer] Times map based on CDC data.  For the most current advice to help you avoid exposure, check the CDC's COVID-19 travel page.  Will the hospital separate me from my newborn and keep the baby in quarantine? If you don't have COVID-19 and have not been exposed to the virus, the hospital will not separate you from your newborn. If you do test positive for COVID-19 or have been exposed but have no symptoms, the CDC, [employer] of Obstetricians and Gynecologists, and the Society for Maternal-Fetal Medicine all recommend that you be separated from your baby to decrease the risk of transmission to the baby. This would last until you are no longer at risk of transmitting the virus.  This scenario would, of course, be beyond heartbreaking. Talk to the hospital, your baby's pediatrician, and your family about how to plan for care of your baby in the event that you have to be separated after delivery. And try to make sure you have the emotional support you would need to endure the sadness and stress of having to potentially wait weeks to meet your newborn.   My hospital is restricting visitors and only allowing one support person. If my support person leaves after the delivery, will they be allowed to  come back? Every hospital has different policies. Contact your hospital or labor and delivery unit a week or so before delivery to get the most up-to-date restrictions. In general, if your support person needs to leave, they would be allowed back unless they knew they were exposed to COVID-19 after leaving your company.  My mom was planning to fly here to help me care for my new baby after delivery. Should I tell her not to come? Yes. If your mom is over 60 or has any serious chronic medical conditions (such as heart disease, lung disease, or diabetes), she is at higher risk of serious illness from COVID-19 and should avoid air travel. And remember that any travel setting increases a person's risk of exposure. So, it may be risky to have her around the baby after she has been traveling. For the most current advice on traveling, check the CDC's COVID-19 travel page.      Person Under Monitoring Name: Alison Hamilton  Location: [address] [place] [address]  Infection Prevention Recommendations for Individuals Confirmed to have, or Being Evaluated for, 2019 Novel Coronavirus (COVID-19) Infection Who Receive Care at Home  Individuals who are confirmed to have, or are being evaluated for, COVID-19 should follow the prevention steps below until a healthcare provider or local or state health department says they can return to normal activities.  Stay home except to get medical care You should restrict activities outside your home, except for getting medical care. Do not go to work, school, or public areas, and do not use public transportation or taxis.  Call ahead before visiting your doctor Before your medical appointment, call the healthcare provider and tell them that you have, or are being evaluated for, COVID-19 infection. This will help the healthcare provider's office take steps to keep other people from getting infected. Ask your healthcare provider to call the  local or state health department.  Monitor your symptoms Seek prompt medical attention if your illness is worsening (e.g., difficulty breathing). Before going to your medical appointment, call the healthcare provider and tell them that you have, or are being evaluated for, COVID-19 infection. Ask your healthcare provider to call the local or state health department.  Wear a facemask You should wear a facemask that covers your nose and mouth when you are in the same room with other people and when you visit a healthcare provider. People who live with or visit you should also wear a facemask while they are in the same room with you.  Separate yourself from other people in your home As much as possible, you should stay in a different room from other people in your home. Also, you should use a separate bathroom, if available.  Avoid sharing household items You should not share dishes, drinking glasses, cups, eating utensils, towels, bedding, or other items with other people in your home. After using these items, you should wash them thoroughly with soap and water.  Cover your coughs and sneezes Cover your mouth and nose with a tissue when you cough or sneeze, or you can cough or sneeze into your sleeve. Throw used tissues in a lined trash can, and immediately wash your hands with soap and water for at least 20 seconds or use an alcohol-based hand rub.  Wash your [employer] your hands often and thoroughly with soap and water for at least 20 seconds. You can use an alcohol-based hand sanitizer if soap and water are not available and if your hands are not visibly dirty. Avoid touching your eyes, nose, and mouth with unwashed hands.   Prevention Steps for Caregivers and Household Members of Individuals Confirmed to have, or Being Evaluated for, COVID-19 Infection Being Cared for in the Home  If you live with, or provide care at home for, a person confirmed to have, or being evaluated for,  COVID-19 infection please follow these guidelines to prevent infection:  Follow healthcare provider's instructions Make sure that you understand and can help the patient follow any healthcare provider instructions for all care.  Provide for the patient's basic needs You should help the patient with basic needs in the home and provide support for getting groceries, prescriptions, and other personal needs.  Monitor the patient's symptoms If they are getting sicker, call his or her medical provider and tell them that the patient has, or is being evaluated for, COVID-19 infection. This will help the healthcare provider's office take steps to keep other people from getting infected. Ask the healthcare provider to call the local or  state health department.  Limit the number of people who have contact with the patient If possible, have only one caregiver for the patient. Other household members should stay in another home or place of residence. If this is not possible, they should stay in another room, or be separated from the patient as much as possible. Use a separate bathroom, if available. Restrict visitors who do not have an essential need to be in the home.  Keep older adults, very young children, and other sick people away from the patient Keep older adults, very young children, and those who have compromised immune systems or chronic health conditions away from the patient. This includes people with chronic heart, lung, or kidney conditions, diabetes, and cancer.  Ensure good ventilation Make sure that shared spaces in the home have good air flow, such as from an air conditioner or an opened window, weather permitting.  Wash your hands often Wash your hands often and thoroughly with soap and water for at least 20 seconds. You can use an alcohol based hand sanitizer if soap and water are not available and if your hands are not visibly dirty. Avoid touching your eyes, nose, and mouth  with unwashed hands. Use disposable paper towels to dry your hands. If not available, use dedicated cloth towels and replace them when they become wet.  Wear a facemask and gloves Wear a disposable facemask at all times in the room and gloves when you touch or have contact with the patient's blood, body fluids, and/or secretions or excretions, such as sweat, saliva, sputum, nasal mucus, vomit, urine, or feces.  Ensure the mask fits over your nose and mouth tightly, and do not touch it during use. Throw out disposable facemasks and gloves after using them. Do not reuse. Wash your hands immediately after removing your facemask and gloves. If your personal clothing becomes contaminated, carefully remove clothing and launder. Wash your hands after handling contaminated clothing. Place all used disposable facemasks, gloves, and other waste in a lined container before disposing them with other household waste. Remove gloves and wash your hands immediately after handling these items.  Do not share dishes, glasses, or other household items with the patient Avoid sharing household items. You should not share dishes, drinking glasses, cups, eating utensils, towels, bedding, or other items with a patient who is confirmed to have, or being evaluated for, COVID-19 infection. After the person uses these items, you should wash them thoroughly with soap and water.  Wash laundry thoroughly Immediately remove and wash clothes or bedding that have blood, body fluids, and/or secretions or excretions, such as sweat, saliva, sputum, nasal mucus, vomit, urine, or feces, on them. Wear gloves when handling laundry from the patient. Read and follow directions on labels of laundry or clothing items and detergent. In general, wash and dry with the warmest temperatures recommended on the label.  Clean all areas the individual has used often Clean all touchable surfaces, such as counters, tabletops, doorknobs, bathroom  fixtures, toilets, phones, keyboards, tablets, and bedside tables, every day. Also, clean any surfaces that may have blood, body fluids, and/or secretions or excretions on them. Wear gloves when cleaning surfaces the patient has come in contact with. Use a diluted bleach solution (e.g., dilute bleach with 1 part bleach and 10 parts water) or a household disinfectant with a label that says EPA-registered for coronaviruses. To make a bleach solution at home, add 1 tablespoon of bleach to 1 quart (4 cups) of water. For a larger  supply, add  cup of bleach to 1 gallon (16 cups) of water. Read labels of cleaning products and follow recommendations provided on product labels. Labels contain instructions for safe and effective use of the cleaning product including precautions you should take when applying the product, such as wearing gloves or eye protection and making sure you have good ventilation during use of the product. Remove gloves and wash hands immediately after cleaning.  Monitor yourself for signs and symptoms of illness Caregivers and household members are considered close contacts, should monitor their health, and will be asked to limit movement outside of the home to the extent possible. Follow the monitoring steps for close contacts listed on the symptom monitoring form.   ? If you have additional questions, contact your local health department or call the epidemiologist on call at [phone] (available 24/7). ? This guidance is subject to change. For the most up-to-date guidance from [facility], please refer to their website: TripMetro.huhttps://www.cdc.gov/coronavirus/2019-ncov/hcp/guidance-prevent-spread.html

## 2021-01-01 NOTE — MAU Note (Signed)
Pt reports having body aches which started last night. Today she began having a headache, fever, and chills. Says temp at prenatal visit today was 101. Says they told her to do a home Covid test which was negative. Denies cntrx, VB, LOF. +FM

## 2021-01-06 DIAGNOSIS — D649 Anemia, unspecified: Secondary | ICD-10-CM | POA: Insufficient documentation

## 2021-01-29 LAB — OB RESULTS CONSOLE HIV ANTIBODY (ROUTINE TESTING): HIV: NONREACTIVE

## 2021-02-25 ENCOUNTER — Other Ambulatory Visit: Payer: Self-pay

## 2021-02-25 ENCOUNTER — Encounter (HOSPITAL_COMMUNITY): Payer: Self-pay | Admitting: Obstetrics & Gynecology

## 2021-02-25 ENCOUNTER — Inpatient Hospital Stay (HOSPITAL_COMMUNITY)
Admission: AD | Admit: 2021-02-25 | Discharge: 2021-02-25 | Disposition: A | Discharge status: Home / Self Care | Payer: Medicaid Other | Attending: Obstetrics & Gynecology | Admitting: Obstetrics & Gynecology

## 2021-02-25 DIAGNOSIS — R109 Unspecified abdominal pain: Secondary | ICD-10-CM | POA: Diagnosis not present

## 2021-02-25 DIAGNOSIS — O26893 Other specified pregnancy related conditions, third trimester: Secondary | ICD-10-CM | POA: Insufficient documentation

## 2021-02-25 DIAGNOSIS — Z3689 Encounter for other specified antenatal screening: Secondary | ICD-10-CM | POA: Insufficient documentation

## 2021-02-25 DIAGNOSIS — B9689 Other specified bacterial agents as the cause of diseases classified elsewhere: Secondary | ICD-10-CM | POA: Insufficient documentation

## 2021-02-25 DIAGNOSIS — O23593 Infection of other part of genital tract in pregnancy, third trimester: Secondary | ICD-10-CM | POA: Insufficient documentation

## 2021-02-25 DIAGNOSIS — Z7982 Long term (current) use of aspirin: Secondary | ICD-10-CM | POA: Insufficient documentation

## 2021-02-25 DIAGNOSIS — Z3A32 32 weeks gestation of pregnancy: Secondary | ICD-10-CM | POA: Diagnosis not present

## 2021-02-25 DIAGNOSIS — O99891 Other specified diseases and conditions complicating pregnancy: Secondary | ICD-10-CM | POA: Diagnosis not present

## 2021-02-25 DIAGNOSIS — O4703 False labor before 37 completed weeks of gestation, third trimester: Secondary | ICD-10-CM

## 2021-02-25 DIAGNOSIS — N760 Acute vaginitis: Secondary | ICD-10-CM

## 2021-02-25 LAB — WET PREP, GENITAL
Clue Cells Wet Prep HPF POC: NONE SEEN
Sperm: NONE SEEN
Trich, Wet Prep: NONE SEEN
WBC, Wet Prep HPF POC: NONE SEEN
Yeast Wet Prep HPF POC: NONE SEEN

## 2021-02-25 LAB — URINALYSIS, ROUTINE W REFLEX MICROSCOPIC
Bilirubin Urine: NEGATIVE
Glucose, UA: NEGATIVE mg/dL
Ketones, ur: 5 mg/dL — AB
Leukocytes,Ua: NEGATIVE
Nitrite: NEGATIVE
Protein, ur: 30 mg/dL — AB
Specific Gravity, Urine: 1.012 (ref 1.005–1.030)
pH: 6 (ref 5.0–8.0)

## 2021-02-25 LAB — OB RESULTS CONSOLE GC/CHLAMYDIA: Gonorrhea: NEGATIVE

## 2021-02-25 LAB — FETAL FIBRONECTIN: Fetal Fibronectin: NEGATIVE

## 2021-02-25 MED ORDER — METRONIDAZOLE 500 MG PO TABS: 500.0000 mg | ORAL_TABLET | Freq: Two times a day (BID) | ORAL | 0 refills | Status: DC

## 2021-02-25 MED ORDER — CYCLOBENZAPRINE HCL 5 MG PO TABS
10.0000 mg | ORAL_TABLET | Freq: Three times a day (TID) | ORAL | Status: DC | PRN
  Administered 2021-02-25: 10 mg via ORAL
  Filled 2021-02-25: qty 2

## 2021-02-25 MED ORDER — CYCLOBENZAPRINE HCL 10 MG PO TABS: 10.0000 mg | ORAL_TABLET | Freq: Three times a day (TID) | ORAL | 0 refills | Status: DC | PRN

## 2021-02-25 MED ORDER — NIFEDIPINE 10 MG PO CAPS
10.0000 mg | ORAL_CAPSULE | ORAL | Status: AC
  Administered 2021-02-25 (×2): 10 mg via ORAL
  Filled 2021-02-25 (×2): qty 1

## 2021-02-25 MED ORDER — LACTATED RINGERS IV BOLUS
1000.0000 mL | Freq: Once | INTRAVENOUS | Status: AC
  Administered 2021-02-25: 1000 mL via INTRAVENOUS

## 2021-02-25 MED ORDER — ACETAMINOPHEN 500 MG PO TABS
1000.0000 mg | ORAL_TABLET | Freq: Four times a day (QID) | ORAL | Status: DC | PRN
  Administered 2021-02-25: 1000 mg via ORAL
  Filled 2021-02-25: qty 2

## 2021-02-25 NOTE — MAU Provider Note (Signed)
History     CSN: [id number]  Arrival date and time: 02/25/21 1726   Event Date/Time   First Provider Initiated Contact with Patient 02/25/21 1808      Chief Complaint  Patient presents with   Abdominal Pain   Pelvic Pressure   40 y.o. [id number] @32 .5 wks presenting with hip pain, LAP and LBP. Reports onset last night but became worse today. States pain is intermittent and rates 8-9/10. Has not treated the pain but the heat from her car seat helped. Back pain has been an ongoing issue d/t hx of disc problem. Denies LOF but reports brown discharge for a few days. Denies ctx. Reports good FM. Denies urinary sx. No GI sx. Denies recent injury or heavy lifting.   OB History     Gravida  6   Para  2   Term  2   Preterm      AB  3   Living  2      SAB      IAB      Ectopic      Multiple      Live Births  2           Past Medical History:  Diagnosis Date   Abnormal Pap smear of cervix     Past Surgical History:  Procedure Laterality Date   FOOT SURGERY     patient states she had broken bones in both of her feet   WISDOM TOOTH EXTRACTION      History reviewed. No pertinent family history.  Social History   Tobacco Use   Smoking status: Every Day    Packs/day: 1.00    Years: 50.00    Pack years: 50.00    Types: Cigarettes    Start date: 05/03/1985   Smokeless tobacco: Never  Vaping Use   Vaping Use: Never used  Substance Use Topics   Alcohol use: No    Comment: hx of ETOH abuse - none since 2011   Drug use: No    Comment: history of crack and ETOH abuse    Allergies: No Known Allergies  Medications Prior to Admission  Medication Sig Dispense Refill Last Dose   aspirin EC 81 MG tablet Take 81 mg by mouth daily. Swallow whole.   02/25/2021   Prenatal Vit-Fe Fumarate-FA (MULTIVITAMIN-PRENATAL) 27-0.8 MG TABS tablet Take 1 tablet by mouth daily at 12 noon.   02/25/2021   acetaminophen (TYLENOL) 500 MG tablet Take 500 mg by mouth every 6 (six)  hours as needed.      promethazine (PHENERGAN) 25 MG tablet Take 1 tablet (25 mg total) by mouth every 6 (six) hours as needed for nausea or vomiting. 30 tablet 0     Review of Systems  Constitutional:  Negative for fever.  Gastrointestinal:  Positive for abdominal pain. Negative for constipation, diarrhea, nausea and vomiting.  Genitourinary:  Positive for vaginal discharge. Negative for dysuria, frequency, hematuria, urgency and vaginal bleeding.  Musculoskeletal:  Positive for back pain.  Physical Exam   Blood pressure (!) 110/49, pulse (!) 109, temperature 98.8 F (37.1 C), temperature source Oral, resp. rate 17, height 5\' 7"  (1.702 m), weight 79.4 kg, last menstrual period 06/16/2020, SpO2 98 %.  Physical Exam Vitals and nursing note reviewed. Exam conducted with a chaperone present.  Constitutional:      General: She is not in acute distress.    Appearance: Normal appearance.  HENT:     Head: Normocephalic and atraumatic.  Pulmonary:  Effort: Pulmonary effort is normal. No respiratory distress.  Abdominal:     Palpations: Abdomen is soft.     Tenderness: There is abdominal tenderness.     Comments: gravid  Genitourinary:    Comments: VE: closed/thick; malodor present Musculoskeletal:        General: Normal range of motion.     Cervical back: Normal range of motion.  Skin:    General: Skin is warm and dry.  Neurological:     General: No focal deficit present.     Mental Status: She is alert and oriented to person, place, and time.  Psychiatric:        Mood and Affect: Mood normal.        Behavior: Behavior normal.  EFM: 150 bpm, mod variability, + accels, no decels Toco: irregular w/UI  Results for orders placed or performed during the hospital encounter of 02/25/21 (from the past 24 hour(s))  Urinalysis, Routine w reflex microscopic Urine, Clean Catch     Status: Abnormal   Collection Time: 02/25/21  5:48 PM  Result Value Ref Range   Color, Urine YELLOW YELLOW    APPearance HAZY (A) CLEAR   Specific Gravity, Urine 1.012 1.005 - 1.030   pH 6.0 5.0 - 8.0   Glucose, UA NEGATIVE NEGATIVE mg/dL   Hgb urine dipstick MODERATE (A) NEGATIVE   Bilirubin Urine NEGATIVE NEGATIVE   Ketones, ur 5 (A) NEGATIVE mg/dL   Protein, ur 30 (A) NEGATIVE mg/dL   Nitrite NEGATIVE NEGATIVE   Leukocytes,Ua NEGATIVE NEGATIVE   RBC / HPF 11-20 0 - 5 RBC/hpf   WBC, UA 0-5 0 - 5 WBC/hpf   Bacteria, UA RARE (A) NONE SEEN   Squamous Epithelial / LPF 0-5 0 - 5   Mucus PRESENT   Wet prep, genital     Status: None   Collection Time: 02/25/21  6:18 PM   Specimen: Vaginal  Result Value Ref Range   Yeast Wet Prep HPF POC NONE SEEN NONE SEEN   Trich, Wet Prep NONE SEEN NONE SEEN   Clue Cells Wet Prep HPF POC NONE SEEN NONE SEEN   WBC, Wet Prep HPF POC NONE SEEN NONE SEEN   Sperm NONE SEEN   Fetal fibronectin     Status: None   Collection Time: 02/25/21  6:21 PM  Result Value Ref Range   Fetal Fibronectin NEGATIVE NEGATIVE   MAU Course  Procedures LR Procardia Flexeril Tylenol  MDM Prenatal records reviewed: pregnancy complicated by AMA, hx HSV, anemia, tobacco use, vit D deficiency, remote hx substance abuse (2011), Covid infection, and hx HPV.  Labs ordered and reviewed. No evidence of UTI or PTL. 1940: Abd pain improved, pt feels better, ctx and UI no longer on toco. Will treat clinically for BV. Stable for discharge home.  Assessment and Plan   1. [redacted] weeks gestation of pregnancy   2. NST (non-stress test) reactive   3. Preterm uterine contractions in third trimester, antepartum   4. Bacterial vaginosis    Discharge home Follow up at [facility] tomorrow as scheduled PTL precautions Rx Flexeril Rx Flagyl  Allergies as of 02/25/2021   No Known Allergies      Medication List     TAKE these medications    acetaminophen 500 MG tablet Commonly known as: TYLENOL Take 500 mg by mouth every 6 (six) hours as needed.   aspirin EC 81 MG tablet Take 81 mg by  mouth daily. Swallow whole.   cyclobenzaprine 10 MG tablet  Commonly known as: FLEXERIL Take 1 tablet (10 mg total) by mouth 3 (three) times daily as needed for muscle spasms.   metroNIDAZOLE 500 MG tablet Commonly known as: FLAGYL Take 1 tablet (500 mg total) by mouth 2 (two) times daily.   multivitamin-prenatal 27-0.8 MG Tabs tablet Take 1 tablet by mouth daily at 12 noon.   promethazine 25 MG tablet Commonly known as: PHENERGAN Take 1 tablet (25 mg total) by mouth every 6 (six) hours as needed for nausea or vomiting.        Donette Larry, CNM 02/25/2021, 7:58 PM

## 2021-02-25 NOTE — Progress Notes (Signed)
Donette Larry CNM in earlier to discuss d/c plan. Written and verbal d/c instructions given and understanding voiced.

## 2021-02-25 NOTE — MAU Note (Signed)
Presents with c/o bilateral pain from mid abdomen down into bilateral hips.  Reports pain began last night but has worsened.  Hasn't taken any meds to alleviate the discomfort.  Denies VB (had brown discharge) or LOF.  Endorses +FM.

## 2021-02-26 LAB — GC/CHLAMYDIA PROBE AMP (~~LOC~~) NOT AT ARMC
Chlamydia: NEGATIVE
Comment: NEGATIVE
Comment: NORMAL
Neisseria Gonorrhea: NEGATIVE

## 2021-02-28 LAB — CULTURE, OB URINE

## 2021-03-09 ENCOUNTER — Inpatient Hospital Stay (HOSPITAL_COMMUNITY)
Admission: AD | Admit: 2021-03-09 | Discharge: 2021-03-09 | Disposition: A | Discharge status: Home / Self Care | Payer: Medicaid Other | Attending: Obstetrics & Gynecology | Admitting: Obstetrics & Gynecology

## 2021-03-09 ENCOUNTER — Other Ambulatory Visit: Payer: Self-pay

## 2021-03-09 ENCOUNTER — Encounter (HOSPITAL_COMMUNITY): Payer: Self-pay | Admitting: Obstetrics & Gynecology

## 2021-03-09 DIAGNOSIS — F1011 Alcohol abuse, in remission: Secondary | ICD-10-CM | POA: Diagnosis not present

## 2021-03-09 DIAGNOSIS — F1911 Other psychoactive substance abuse, in remission: Secondary | ICD-10-CM | POA: Diagnosis not present

## 2021-03-09 DIAGNOSIS — O1213 Gestational proteinuria, third trimester: Secondary | ICD-10-CM | POA: Diagnosis not present

## 2021-03-09 DIAGNOSIS — F17210 Nicotine dependence, cigarettes, uncomplicated: Secondary | ICD-10-CM | POA: Diagnosis not present

## 2021-03-09 DIAGNOSIS — O09523 Supervision of elderly multigravida, third trimester: Secondary | ICD-10-CM | POA: Insufficient documentation

## 2021-03-09 DIAGNOSIS — Z3689 Encounter for other specified antenatal screening: Secondary | ICD-10-CM

## 2021-03-09 DIAGNOSIS — D649 Anemia, unspecified: Secondary | ICD-10-CM | POA: Diagnosis not present

## 2021-03-09 DIAGNOSIS — E559 Vitamin D deficiency, unspecified: Secondary | ICD-10-CM | POA: Diagnosis not present

## 2021-03-09 DIAGNOSIS — Z8616 Personal history of COVID-19: Secondary | ICD-10-CM | POA: Diagnosis not present

## 2021-03-09 DIAGNOSIS — O99333 Smoking (tobacco) complicating pregnancy, third trimester: Secondary | ICD-10-CM | POA: Insufficient documentation

## 2021-03-09 DIAGNOSIS — Z3A34 34 weeks gestation of pregnancy: Secondary | ICD-10-CM | POA: Insufficient documentation

## 2021-03-09 DIAGNOSIS — F1411 Cocaine abuse, in remission: Secondary | ICD-10-CM | POA: Diagnosis not present

## 2021-03-09 LAB — COMPREHENSIVE METABOLIC PANEL
ALT: 19 U/L (ref 0–44)
AST: 17 U/L (ref 15–41)
Albumin: 2.8 g/dL — ABNORMAL LOW (ref 3.5–5.0)
Alkaline Phosphatase: 71 U/L (ref 38–126)
Anion gap: 7 (ref 5–15)
BUN: <5 mg/dL — ABNORMAL LOW (ref 6–20)
CO2: 21 mmol/L — ABNORMAL LOW (ref 22–32)
Calcium: 9 mg/dL (ref 8.9–10.3)
Chloride: 107 mmol/L (ref 98–111)
Creatinine, Ser: 0.8 mg/dL (ref 0.44–1.00)
GFR, Estimated: >60 mL/min (ref >60)
Glucose, Bld: 89 mg/dL (ref 70–99)
Potassium: 4.2 mmol/L (ref 3.5–5.1)
Sodium: 135 mmol/L (ref 135–145)
Total Bilirubin: 0.3 mg/dL (ref 0.3–1.2)
Total Protein: 6.3 g/dL — ABNORMAL LOW (ref 6.5–8.1)

## 2021-03-09 LAB — URINALYSIS, ROUTINE W REFLEX MICROSCOPIC
Bilirubin Urine: NEGATIVE
Glucose, UA: NEGATIVE mg/dL
Ketones, ur: NEGATIVE mg/dL
Leukocytes,Ua: NEGATIVE
Nitrite: NEGATIVE
Protein, ur: NEGATIVE mg/dL
Specific Gravity, Urine: 1.01 (ref 1.005–1.030)
pH: 7 (ref 5.0–8.0)

## 2021-03-09 LAB — CBC
HCT: 28.9 % — ABNORMAL LOW (ref 36.0–46.0)
Hemoglobin: 9.9 g/dL — ABNORMAL LOW (ref 12.0–15.0)
MCH: 31.4 pg (ref 26.0–34.0)
MCHC: 34.3 g/dL (ref 30.0–36.0)
MCV: 91.7 fL (ref 80.0–100.0)
Platelets: 403 10*3/uL — ABNORMAL HIGH (ref 150–400)
RBC: 3.15 MIL/uL — ABNORMAL LOW (ref 3.87–5.11)
RDW: 13.2 % (ref 11.5–15.5)
WBC: 14.6 10*3/uL — ABNORMAL HIGH (ref 4.0–10.5)
nRBC: 0 % (ref 0.0–0.2)

## 2021-03-09 LAB — PROTEIN / CREATININE RATIO, URINE
Creatinine, Urine: 123.31 mg/dL
Protein Creatinine Ratio: 0.22 mg/mg{Cre} — ABNORMAL HIGH (ref 0.00–0.15)
Total Protein, Urine: 27 mg/dL

## 2021-03-09 NOTE — MAU Note (Signed)
.  Alison Hamilton is a 40 y.o. at [redacted]w[redacted]d here in MAU reporting: Was sent over from her office because she had protein in her urine. States she is not having any HA, blurry vision, or swelling. Denies Vb or LOF. Endorses good fetal movement. Denies pain.   Vitals:   03/09/21 1317  BP: 111/75  Pulse: (!) 103  Resp: 15  Temp: 98 F (36.7 C)  SpO2: 97%      Lab orders placed from triage:  UA

## 2021-03-09 NOTE — MAU Provider Note (Signed)
History     CSN: [id number]  Arrival date and time: 03/09/21 1309   Event Date/Time   First Provider Initiated Contact with Patient 03/09/21 1346     40 y.o. [id number] @34 .3 wks sent from office for proteinuria and BP check. Pt reports being called by the office today and told she had protein in her urine from visit 4 days ago and to come to MAU for eval. Denies HA, visual disturbances, RUQ pain, SOB, and CP. No pregnancy complaints. +FM.   OB History     Gravida  6   Para  2   Term  2   Preterm      AB  3   Living  2      SAB      IAB      Ectopic      Multiple      Live Births  2           Past Medical History:  Diagnosis Date   Abnormal Pap smear of cervix     Past Surgical History:  Procedure Laterality Date   FOOT SURGERY     patient states she had broken bones in both of her feet   WISDOM TOOTH EXTRACTION      History reviewed. No pertinent family history.  Social History   Tobacco Use   Smoking status: Every Day    Packs/day: 1.00    Years: 50.00    Pack years: 50.00    Types: Cigarettes    Start date: 05/03/1985   Smokeless tobacco: Never  Vaping Use   Vaping Use: Never used  Substance Use Topics   Alcohol use: No    Comment: hx of ETOH abuse - none since 2011   Drug use: No    Comment: history of crack and ETOH abuse    Allergies: No Known Allergies  No medications prior to admission.    Review of Systems  Eyes:  Negative for visual disturbance.  Respiratory:  Negative for shortness of breath.   Cardiovascular:  Negative for chest pain.  Gastrointestinal:  Negative for abdominal pain.  Genitourinary:  Negative for dysuria, hematuria, urgency, vaginal bleeding and vaginal discharge.  Neurological:  Negative for headaches.  Physical Exam   Blood pressure 121/83, pulse 96, temperature 98 F (36.7 C), temperature source Oral, resp. rate 15, weight 77.2 kg, last menstrual period 06/16/2020, SpO2 98 %. Patient Vitals for the  past 24 hrs:  BP Temp Temp src Pulse Resp SpO2 Weight  03/09/21 1545 121/83 -- -- 96 -- 98 % --  03/09/21 1530 125/81 -- -- 98 -- 98 % --  03/09/21 1515 121/78 -- -- (!) 101 -- 99 % --  03/09/21 1500 111/76 -- -- 95 -- 99 % --  03/09/21 1445 116/72 -- -- (!) 103 -- 99 % --  03/09/21 1431 129/80 -- -- (!) 104 -- -- --  03/09/21 1416 117/87 -- -- 100 -- -- --  03/09/21 1400 104/83 -- -- (!) 104 -- 98 % --  03/09/21 1345 104/79 -- -- (!) 105 -- 98 % --  03/09/21 1337 116/85 -- -- (!) 104 -- -- --  03/09/21 1317 111/75 98 F (36.7 C) Oral (!) 103 15 97 % 77.2 kg    Physical Exam Vitals and nursing note reviewed.  Constitutional:      General: She is not in acute distress.    Appearance: Normal appearance.  HENT:     Head: Normocephalic and atraumatic.  Pulmonary:     Effort: Pulmonary effort is normal. No respiratory distress.  Musculoskeletal:        General: Normal range of motion.     Cervical back: Normal range of motion.  Neurological:     General: No focal deficit present.     Mental Status: She is alert and oriented to person, place, and time.  Psychiatric:        Mood and Affect: Mood normal.        Behavior: Behavior normal.  EFM: 145 bpm, mod variability, + accels, no decels Toco: none  Results for orders placed or performed during the hospital encounter of 03/09/21 (from the past 24 hour(s))  Urinalysis, Routine w reflex microscopic Urine, Clean Catch     Status: Abnormal   Collection Time: 03/09/21  1:34 PM  Result Value Ref Range   Color, Urine YELLOW YELLOW   APPearance HAZY (A) CLEAR   Specific Gravity, Urine 1.010 1.005 - 1.030   pH 7.0 5.0 - 8.0   Glucose, UA NEGATIVE NEGATIVE mg/dL   Hgb urine dipstick SMALL (A) NEGATIVE   Bilirubin Urine NEGATIVE NEGATIVE   Ketones, ur NEGATIVE NEGATIVE mg/dL   Protein, ur NEGATIVE NEGATIVE mg/dL   Nitrite NEGATIVE NEGATIVE   Leukocytes,Ua NEGATIVE NEGATIVE   RBC / HPF 6-10 0 - 5 RBC/hpf   WBC, UA 0-5 0 - 5 WBC/hpf    Bacteria, UA MANY (A) NONE SEEN   Squamous Epithelial / LPF 0-5 0 - 5   Mucus PRESENT   Protein / creatinine ratio, urine     Status: Abnormal   Collection Time: 03/09/21  1:46 PM  Result Value Ref Range   Creatinine, Urine 123.31 mg/dL   Total Protein, Urine 27 mg/dL   Protein Creatinine Ratio 0.22 (H) 0.00 - 0.15 mg/mg[Cre]  CBC     Status: Abnormal   Collection Time: 03/09/21  2:04 PM  Result Value Ref Range   WBC 14.6 (H) 4.0 - 10.5 K/uL   RBC 3.15 (L) 3.87 - 5.11 MIL/uL   Hemoglobin 9.9 (L) 12.0 - 15.0 g/dL   HCT 53.6 (L) 14.4 - 31.5 %   MCV 91.7 80.0 - 100.0 fL   MCH 31.4 26.0 - 34.0 pg   MCHC 34.3 30.0 - 36.0 g/dL   RDW 40.0 86.7 - 61.9 %   Platelets 403 (H) 150 - 400 K/uL   nRBC 0.0 0.0 - 0.2 %  Comprehensive metabolic panel     Status: Abnormal   Collection Time: 03/09/21  2:04 PM  Result Value Ref Range   Sodium 135 135 - 145 mmol/L   Potassium 4.2 3.5 - 5.1 mmol/L   Chloride 107 98 - 111 mmol/L   CO2 21 (L) 22 - 32 mmol/L   Glucose, Bld 89 70 - 99 mg/dL   BUN <5 (L) 6 - 20 mg/dL   Creatinine, Ser 5.09 0.44 - 1.00 mg/dL   Calcium 9.0 8.9 - 32.6 mg/dL   Total Protein 6.3 (L) 6.5 - 8.1 g/dL   Albumin 2.8 (L) 3.5 - 5.0 g/dL   AST 17 15 - 41 U/L   ALT 19 0 - 44 U/L   Alkaline Phosphatase 71 38 - 126 U/L   Total Bilirubin 0.3 0.3 - 1.2 mg/dL   GFR, Estimated >71 >24 mL/min   Anion gap 7 5 - 15   MAU Course  Procedures  MDM Prenatal records reviewed: pregnancy complicated by AMA, hx HSV, anemia, tobacco use, vit D deficiency, remote  hx substance abuse (2011), Covid infection, and hx HPV.  Labs ordered and reviewed. Normotensive. No signs of PEC. UA with many bacteria, denies urinary sx, will send UC. Stable for discharge home.   Assessment and Plan   1. [redacted] weeks gestation of pregnancy   2. NST (non-stress test) reactive    Discharge home Follow up at [place] this week as scheduled PEC precautions  Allergies as of 03/09/2021   No Known Allergies       Medication List     TAKE these medications    acetaminophen 500 MG tablet Commonly known as: TYLENOL Take 500 mg by mouth every 6 (six) hours as needed.   aspirin EC 81 MG tablet Take 81 mg by mouth daily. Swallow whole.   cyclobenzaprine 10 MG tablet Commonly known as: FLEXERIL Take 1 tablet (10 mg total) by mouth 3 (three) times daily as needed for muscle spasms.   metroNIDAZOLE 500 MG tablet Commonly known as: FLAGYL Take 1 tablet (500 mg total) by mouth 2 (two) times daily.   multivitamin-prenatal 27-0.8 MG Tabs tablet Take 1 tablet by mouth daily at 12 noon.   promethazine 25 MG tablet Commonly known as: PHENERGAN Take 1 tablet (25 mg total) by mouth every 6 (six) hours as needed for nausea or vomiting.       Julianne Handler, CNM 03/09/2021, 4:37 PM

## 2021-03-10 LAB — CULTURE, OB URINE

## 2021-03-23 LAB — OB RESULTS CONSOLE GBS: GBS: NEGATIVE

## 2021-04-02 ENCOUNTER — Other Ambulatory Visit: Payer: Self-pay | Admitting: Family Medicine

## 2021-04-02 ENCOUNTER — Other Ambulatory Visit: Payer: Self-pay | Admitting: Obstetrics & Gynecology

## 2021-04-03 ENCOUNTER — Other Ambulatory Visit: Payer: Self-pay | Admitting: Obstetrics & Gynecology

## 2021-04-04 ENCOUNTER — Other Ambulatory Visit: Payer: Self-pay

## 2021-04-04 ENCOUNTER — Inpatient Hospital Stay (HOSPITAL_COMMUNITY)
Admission: AD | Admit: 2021-04-04 | Discharge: 2021-04-06 | DRG: 807 | Disposition: A | Discharge status: Home / Self Care | Payer: Medicaid Other | Attending: Obstetrics & Gynecology | Admitting: Obstetrics & Gynecology

## 2021-04-04 ENCOUNTER — Inpatient Hospital Stay (HOSPITAL_COMMUNITY): Payer: Medicaid Other

## 2021-04-04 ENCOUNTER — Encounter (HOSPITAL_COMMUNITY): Payer: Self-pay | Admitting: Obstetrics & Gynecology

## 2021-04-04 DIAGNOSIS — Z7982 Long term (current) use of aspirin: Secondary | ICD-10-CM

## 2021-04-04 DIAGNOSIS — D509 Iron deficiency anemia, unspecified: Secondary | ICD-10-CM | POA: Diagnosis present

## 2021-04-04 DIAGNOSIS — O99334 Smoking (tobacco) complicating childbirth: Secondary | ICD-10-CM | POA: Diagnosis present

## 2021-04-04 DIAGNOSIS — Z3A38 38 weeks gestation of pregnancy: Secondary | ICD-10-CM

## 2021-04-04 DIAGNOSIS — Z8616 Personal history of COVID-19: Secondary | ICD-10-CM

## 2021-04-04 DIAGNOSIS — O365930 Maternal care for other known or suspected poor fetal growth, third trimester, not applicable or unspecified: Principal | ICD-10-CM | POA: Diagnosis present

## 2021-04-04 DIAGNOSIS — O9902 Anemia complicating childbirth: Secondary | ICD-10-CM | POA: Diagnosis present

## 2021-04-04 DIAGNOSIS — O1214 Gestational proteinuria, complicating childbirth: Secondary | ICD-10-CM | POA: Diagnosis present

## 2021-04-04 DIAGNOSIS — Z20822 Contact with and (suspected) exposure to covid-19: Secondary | ICD-10-CM | POA: Diagnosis present

## 2021-04-04 DIAGNOSIS — F17210 Nicotine dependence, cigarettes, uncomplicated: Secondary | ICD-10-CM | POA: Diagnosis present

## 2021-04-04 DIAGNOSIS — Z3490 Encounter for supervision of normal pregnancy, unspecified, unspecified trimester: Secondary | ICD-10-CM | POA: Diagnosis present

## 2021-04-04 LAB — COMPREHENSIVE METABOLIC PANEL
ALT: 8 U/L (ref 0–44)
AST: 15 U/L (ref 15–41)
Albumin: 2.9 g/dL — ABNORMAL LOW (ref 3.5–5.0)
Alkaline Phosphatase: 105 U/L (ref 38–126)
Anion gap: 7 (ref 5–15)
BUN: 6 mg/dL (ref 6–20)
CO2: 20 mmol/L — ABNORMAL LOW (ref 22–32)
Calcium: 9.2 mg/dL (ref 8.9–10.3)
Chloride: 107 mmol/L (ref 98–111)
Creatinine, Ser: 0.74 mg/dL (ref 0.44–1.00)
GFR, Estimated: >60 mL/min (ref >60)
Glucose, Bld: 80 mg/dL (ref 70–99)
Potassium: 3.9 mmol/L (ref 3.5–5.1)
Sodium: 134 mmol/L — ABNORMAL LOW (ref 135–145)
Total Bilirubin: 0.6 mg/dL (ref 0.3–1.2)
Total Protein: 6 g/dL — ABNORMAL LOW (ref 6.5–8.1)

## 2021-04-04 LAB — RESP PANEL BY RT-PCR (FLU A&B, COVID) ARPGX2
Influenza A by PCR: NEGATIVE
Influenza B by PCR: NEGATIVE
SARS Coronavirus 2 by RT PCR: NEGATIVE

## 2021-04-04 LAB — CBC
HCT: 30.3 % — ABNORMAL LOW (ref 36.0–46.0)
Hemoglobin: 10.7 g/dL — ABNORMAL LOW (ref 12.0–15.0)
MCH: 32.9 pg (ref 26.0–34.0)
MCHC: 35.3 g/dL (ref 30.0–36.0)
MCV: 93.2 fL (ref 80.0–100.0)
Platelets: 275 10*3/uL (ref 150–400)
RBC: 3.25 MIL/uL — ABNORMAL LOW (ref 3.87–5.11)
RDW: 13.2 % (ref 11.5–15.5)
WBC: 12 10*3/uL — ABNORMAL HIGH (ref 4.0–10.5)
nRBC: 0 % (ref 0.0–0.2)

## 2021-04-04 LAB — TYPE AND SCREEN
ABO/RH(D): A POS
Antibody Screen: NEGATIVE

## 2021-04-04 MED ORDER — TERBUTALINE SULFATE 1 MG/ML IJ SOLN: 0.2500 mg | Freq: Once | INTRAMUSCULAR | Status: DC | PRN

## 2021-04-04 MED ORDER — LACTATED RINGERS IV SOLN
500.0000 mL | INTRAVENOUS | Status: DC | PRN
  Administered 2021-04-05: 15:00:00 1000 mL via INTRAVENOUS

## 2021-04-04 MED ORDER — LACTATED RINGERS IV SOLN: INTRAVENOUS | Status: DC

## 2021-04-04 MED ORDER — FENTANYL CITRATE (PF) 100 MCG/2ML IJ SOLN
50.0000 ug | INTRAMUSCULAR | Status: DC | PRN
  Administered 2021-04-05: 12:00:00 50 ug via INTRAVENOUS
  Administered 2021-04-05 (×2): 100 ug via INTRAVENOUS
  Filled 2021-04-04 (×3): qty 2

## 2021-04-04 MED ORDER — LIDOCAINE HCL (PF) 1 % IJ SOLN: 30.0000 mL | INTRAMUSCULAR | Status: DC | PRN

## 2021-04-04 MED ORDER — OXYCODONE-ACETAMINOPHEN 5-325 MG PO TABS: 2.0000 | ORAL_TABLET | ORAL | Status: DC | PRN

## 2021-04-04 MED ORDER — ACETAMINOPHEN 325 MG PO TABS: 650.0000 mg | ORAL_TABLET | ORAL | Status: DC | PRN

## 2021-04-04 MED ORDER — OXYTOCIN-SODIUM CHLORIDE 30-0.9 UT/500ML-% IV SOLN
1.0000 m[IU]/min | INTRAVENOUS | Status: DC
  Administered 2021-04-04: 2 m[IU]/min via INTRAVENOUS

## 2021-04-04 MED ORDER — SOD CITRATE-CITRIC ACID 500-334 MG/5ML PO SOLN: 30.0000 mL | ORAL | Status: DC | PRN

## 2021-04-04 MED ORDER — OXYTOCIN BOLUS FROM INFUSION
333.0000 mL | Freq: Once | INTRAVENOUS | Status: AC
  Administered 2021-04-05: 21:00:00 333 mL via INTRAVENOUS

## 2021-04-04 MED ORDER — ONDANSETRON HCL 4 MG/2ML IJ SOLN
4.0000 mg | Freq: Four times a day (QID) | INTRAMUSCULAR | Status: DC | PRN
  Administered 2021-04-05: 17:00:00 4 mg via INTRAVENOUS
  Filled 2021-04-04: qty 2

## 2021-04-04 MED ORDER — MISOPROSTOL 25 MCG QUARTER TABLET
25.0000 ug | ORAL_TABLET | ORAL | Status: DC | PRN
  Filled 2021-04-04: qty 1

## 2021-04-04 MED ORDER — OXYTOCIN-SODIUM CHLORIDE 30-0.9 UT/500ML-% IV SOLN
2.5000 [IU]/h | INTRAVENOUS | Status: DC
  Administered 2021-04-05: 21:00:00 2.5 [IU]/h via INTRAVENOUS
  Filled 2021-04-04 (×2): qty 500

## 2021-04-04 MED ORDER — OXYCODONE-ACETAMINOPHEN 5-325 MG PO TABS: 1.0000 | ORAL_TABLET | ORAL | Status: DC | PRN

## 2021-04-04 NOTE — H&P (Signed)
Alison Hamilton is a 40 y.o. female, [phone], IUP at 38.1 weeks, presenting for IOL for IUGR, US on 12/1 HC 7%, AC 9%, AFI 11.6, 6.5lbs, vertex, BPP 8/8.  Pt also found to have PCR of 0.358 on 12/1, no elevated BP noted, will monitor for developing preE, pt is asymptomatic, denies HA, RUQ pain or vision changes. Pt endorse + Fm. Denies vaginal leakage. Denies vaginal bleeding. Denies feeling cxt's.   Pregnancy Problems acute COVID-19 (Dx 01/01/21) advanced maternal age gravida (age 40, NIPS negative) Anemia (Hgb 9.9 on 9/1, rx'd Fe.) genital herpes simplex (per titers on NOB+, denies being on valterex, no outbreaks during pregnancy, spec neg, denies prodromal s/sx) history of 3 miscarriages history of human papilloma virus infection (2020, normal cytology, positive HPV.  Pap at [facility].) history of substance abuse (Crack and etoh in past, clean since 2011 per EPIC notes.  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      aspirin EC 81 MG tablet Take 81 mg by mouth daily. Swallow whole.      cyclobenzaprine (FLEXERIL) 10 MG tablet Take 1 tablet (10 mg total) by mouth 3 (three) times daily as needed for muscle spasms. 30 tablet 0    metroNIDAZOLE (FLAGYL)  500 MG tablet Take 1 tablet (500 mg total) by mouth 2 (two) times daily. 14 tablet 0    Prenatal Vit-Fe Fumarate-FA (MULTIVITAMIN-PRENATAL) 27-0.8 MG TABS tablet Take 1 tablet by mouth daily at 12 noon.      promethazine (PHENERGAN) 25 MG tablet Take 1 tablet (25 mg total) by mouth every 6 (six) hours as needed for nausea or vomiting. 30 tablet 0     Past Medical History:  Diagnosis Date   Abnormal Pap smear of cervix      No current facility-administered medications on file prior to encounter.   Current Outpatient Medications on File Prior to Encounter  Medication Sig Dispense Refill   acetaminophen (TYLENOL) 500 MG tablet Take 500 mg by mouth every 6 (six) hours as needed.     aspirin EC 81 MG tablet Take 81 mg by mouth daily. Swallow whole.     cyclobenzaprine (FLEXERIL) 10 MG tablet Take 1 tablet (10 mg total) by mouth 3 (three) times daily as needed for muscle spasms. 30 tablet 0   metroNIDAZOLE (FLAGYL) 500 MG tablet Take 1 tablet (500 mg total) by mouth 2 (two) times daily. 14 tablet 0   Prenatal Vit-Fe Fumarate-FA (MULTIVITAMIN-PRENATAL) 27-0.8 MG TABS tablet Take 1 tablet by mouth daily at 12 noon.     promethazine (PHENERGAN) 25 MG tablet Take 1 tablet (25 mg total) by mouth every 6 (six) hours as needed for  nausea or vomiting. 30 tablet 0     No Known Allergies  History of present pregnancy: Pt Info/Preference:  Screening/Consents:  Labs:   EDD: Estimated Date of Delivery: 04/17/21  Establised: Patient's last menstrual period was 06/16/2020.  Anatomy Scan: Date: 9/11 Placenta Location: anterior Genetic Screen: Panoroma:LR female AFP:  First Tri: Quad:  Office: [facility]             First PNV: 16.1 weeks Blood Type --/--/A POS (12/03 1213)  Language: english Last PNV: 37.6 weeks Rhogam    Flu Vaccine:  UTD   Antibody NEG (12/03 1213)  TDaP vaccine YTD   GTT: Early: 5.2 Third Trimester: 95  Feeding Plan: Breast/bottle BTL: no Rubella: Immune (06/09 0000)  Contraception: ???  VBAC: no RPR:   Immune  Circumcision: In pt desired   HBsAg: Negative (06/09 0000)  Pediatrician:  ???   HIV: Non-reactive (09/29 0000)   Prenatal Classes: no Additional [place]: Yes see HPI GBS: Negative/-- (11/21 0000)(For PCN allergy, check sensitivities)       Chlamydia: neg    MFM Referral/Consult:  GC: neg  Support Person: partner   PAP: 2022 HPV+  Pain Management: epidural Neonatologist Referral:  Hgb Electrophoresis:  AS, FOB ???  Birth Plan: [place]   Hgb NOB: 15    28W: 9.7   OB History     Gravida  6   Para  2   Term  2   Preterm      AB  3   Living  2      SAB      IAB      Ectopic      Multiple      Live Births  2          Past Medical History:  Diagnosis Date   Abnormal Pap smear of cervix    Past Surgical History:  Procedure Laterality Date   FOOT SURGERY     patient states she had broken bones in both of her feet   WISDOM TOOTH EXTRACTION     Family History: family history is not on file. Social History:  reports that she has been smoking cigarettes. She started smoking about 35 years ago. She has a 15.00 pack-year smoking history. She has never used smokeless tobacco. She reports that she does not drink alcohol and does not use drugs.   Prenatal Transfer Tool  Maternal Diabetes: No Genetic Screening: Normal Maternal Ultrasounds/Referrals: Normal Fetal Ultrasounds or other Referrals:  None Maternal Substance Abuse:  No Significant Maternal Medications:  None Significant Maternal Lab Results: Group B Strep negative and Other: HSV+, no lesion  ROS:  Review of Systems  Constitutional: Negative.   HENT: Negative.    Eyes: Negative.   Respiratory: Negative.    Cardiovascular: Negative.   Gastrointestinal: Negative.   Genitourinary: Negative.   Musculoskeletal: Negative.   Skin: Negative.   Neurological: Negative.   Endo/Heme/Allergies: Negative.   Psychiatric/Behavioral: Negative.      Physical Exam: BP 128/84   Pulse 95   Temp 98.2  F (36.8 C) (Oral)   Resp 18   Ht 5\' 7"  (1.702 m)   Wt 79.4 kg   LMP 06/16/2020   BMI 27.41 kg/m   Physical Exam Vitals and nursing note reviewed.  Constitutional:      Appearance: Normal appearance.  HENT:     Head: Normocephalic.     Nose: Nose normal.     Mouth/Throat:     Mouth: Mucous membranes  are moist.  Eyes:     Pupils: Pupils are equal, round, and reactive to light.  Cardiovascular:     Rate and Rhythm: Normal rate and regular rhythm.     Pulses: Normal pulses.     Heart sounds: Normal heart sounds.  Pulmonary:     Effort: Pulmonary effort is normal.     Breath sounds: Normal breath sounds.  Abdominal:     General: Bowel sounds are normal.  Genitourinary:    General: Normal vulva.     Rectum: Normal.     Comments: Uterus gravida equal to dates, pelvis adequate for vaginal delivery, spec exam no lesions. Pelvis proven 5.5lbs Musculoskeletal:        General: Normal range of motion.     Cervical back: Normal range of motion and neck supple.  Skin:    General: Skin is warm.     Capillary Refill: Capillary refill takes less than 2 seconds.  Neurological:     General: No focal deficit present.     Mental Status: She is alert.  Psychiatric:        Mood and Affect: Mood normal.     NST: FHR baseline 125 bpm, Variability: moderate, Accelerations:present, Decelerations:  Absent= Cat 1/Reactive UC:   none SVE:   Dilation: 2.5 Effacement (%): 70 Station: -2 Exam by:: [facility], CNM, vertex verified by fetal sutures.  Leopold's: Position vertex, EFW 6.5lbs via leopold's.   Labs: Results for orders placed or performed during the hospital encounter of 04/04/21 (from the past 24 hour(s))  Type and screen [facility]     Status: None   Collection Time: 04/04/21 12:13 PM  Result Value Ref Range   ABO/RH(D) A POS    Antibody Screen NEG    Sample Expiration      04/07/2021,2359 Performed at [facility], [address], [place],  [place] [address]   Resp Panel by RT-PCR (Flu A&B, Covid) Nasopharyngeal Swab     Status: None   Collection Time: 04/04/21 12:29 PM   Specimen: Nasopharyngeal Swab; Nasopharyngeal(NP) swabs in vial transport medium  Result Value Ref Range   SARS Coronavirus 2 by RT PCR NEGATIVE NEGATIVE   Influenza A by PCR NEGATIVE NEGATIVE   Influenza B by PCR NEGATIVE NEGATIVE    Imaging:  No results found.  MAU Course: Orders Placed This Encounter  Procedures   Resp Panel by RT-PCR (Flu A&B, Covid) Nasopharyngeal Swab   OB RESULT CONSOLE Group B Strep   OB RESULTS CONSOLE GC/Chlamydia   OB RESULTS CONSOLE HIV antibody   OB RESULTS CONSOLE Rubella Antibody   OB RESULTS CONSOLE Hepatitis B surface antigen   CBC   RPR   Urine rapid drug screen (hosp performed)not at [facility]   Comprehensive metabolic panel   Diet regular Room service appropriate? Yes; Fluid consistency: Thin   Evaluate fetal heart rate to establish reassuring pattern prior to initiating Cytotec or Pitocin   Perform a cervical exam prior to initiating Cytotec or Pitocin   Discontinue Pitocin if tachysystole with non-reassuring FHR is present   Nofify MD/CNM if tachysystole with non-reassuring FHR is present   Initiate intrauterine resuscitation if tachysystole with non-reasuring FHR is present   If tachysystole WITH reassuring FHR present notify MD / CNM   May administer Terbutaline 0.25 mg SQ x 1 dose if tachysystole with non-reassuring FHR is presesnt   Labor Induction   Vitals signs per unit policy   Notify physician (specify)  Fetal monitoring per unit policy   Activity as tolerated   Cervical Exam   Measure blood pressure post delivery every 15 min x 1 hour then every 30 min x 1 hour   Fundal check post delivery every 15 min x 1 hour then every 30 min x 1 hour   If Rapid HIV test positive or known HIV positive: initiate AZT orders   May in and out cath x 2 for inability to void   Insert urethral catheter X 1 PRN If Coude  Catheter is chosen, qualified resources by campus can be found in the clinical skills nursing procedure for Coude Catheter 1. If straight catheterized > 2 times or patient unable to void post epidural plac...   Refer to Sidebar Report Urinary (Foley) Catheter Indications   Refer to Sidebar Report Post Indwelling Urinary Catheter Removal and Intervention Guidelines   Discontinue foley prior to vaginal delivery   Initiate Carrier Fluid Protocol   Initiate Oral Care Protocol   Patient may have epidural placement upon request   Labor Induction   Full code   Nitrous Oxide 50%/Oxygen 50%   Type and screen [place] and maintain IV Line   Admit to Inpatient (patient's expected length of stay will be greater than 2 midnights or inpatient only procedure)   Meds ordered this encounter  Medications   terbutaline (BRETHINE) injection 0.25 mg   misoprostol (CYTOTEC) tablet 25 mcg   lactated ringers infusion   oxytocin (PITOCIN) IV BOLUS FROM BAG   oxytocin (PITOCIN) IV infusion 30 units in NS 500 mL - Premix   lactated ringers infusion 500-1,000 mL   acetaminophen (TYLENOL) tablet 650 mg   oxyCODONE-acetaminophen (PERCOCET/ROXICET) 5-325 MG per tablet 1 tablet   oxyCODONE-acetaminophen (PERCOCET/ROXICET) 5-325 MG per tablet 2 tablet   ondansetron (ZOFRAN) injection 4 mg   sodium citrate-citric acid (ORACIT) solution 30 mL   lidocaine (PF) (XYLOCAINE) 1 % injection 30 mL   fentaNYL (SUBLIMAZE) injection 50-100 mcg   oxytocin (PITOCIN) IV infusion 30 units in NS 500 mL - Premix    Order Specific Question:   Begin infusion at:    Answer:   2 milli-units/min (2 mL/hr)    Order Specific Question:   Increase infusion by:    Answer:   2 milli-units/min (2 mL/hr)    Assessment/Plan: Alison Hamilton is a 40 y.o. female, [phone], IUP at 38.1 weeks, presenting for IOL for IUGR, [place] on 12/1 HC 7%, AC 9%, AFI 11.6, 6.5lbs, vertex, BPP 8/8.  Pt also found to have PCR of 0.358  on 12/1, no elevated BP noted, will monitor for developing preE, pt is asymptomatic, denies HA, RUQ pain or vision changes. Pt endorse + Fm. Denies vaginal leakage. Denies vaginal bleeding. Denies feeling cxt's.   Pregnancy Problems acute COVID-19 (Dx 01/01/21) advanced maternal age gravida (age 84, NIPS negative) Anemia (Hgb 9.9 on 9/1, rx'd Fe.) genital herpes simplex (per titers on NOB+, denies being on valterex, no outbreaks during pregnancy, spec neg, denies prodromal s/sx) history of 3 miscarriages history of human papilloma virus infection (2020, normal cytology, positive HPV.  Pap at [facility].) history of substance abuse (Crack and etoh in past, clean since 2011 per EPIC notes.  UDS neg at [facility], redrawn on admission.) Smoker (encourage cessation, Rx'd nicotine patches.) vitamin D deficiency  FWB: Cat 1 Fetal Tracing.   Plan: Admit to [place] per consult with DR Alesia Richards Routine [facility] orders Pain med/epidural prn Plan for pitocin  for IOL H/O Substance use: USD Proteinuria: Monitor for BP, s/sx, and CMP.  Anticipate labor progression   Alison Pick NP-C, CNM, MSN 04/04/2021, 3:40 PM

## 2021-04-04 NOTE — Progress Notes (Signed)
Labor Progress Note  Alison Hamilton is a 40 y.o. female, [phone], IUP at 38.1 weeks, presenting for IOL for IUGR, [place] on 12/1 HC 7%, AC 9%, AFI 11.6, 6.5lbs, vertex, BPP 8/8.  Pt also found to have PCR of 0.358 on 12/1, no elevated BP noted, will monitor for developing preE, pt is asymptomatic, denies HA, RUQ pain or vision changes  Subjective: Pt stable in bed in NAD. Partner at bedside and supportive. Pt endorses feeling mild cxt.  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 No intake/output data recorded. NST: FHR baseline 130 bpm, Variability: moderate, Accelerations:present, Decelerations:  Absent= Cat 1/Reactive CTX:  irregular, every 3-10 minutes Uterus gravid, soft non tender, moderate to palpate with contractions.  SVE:  Dilation: 2.5 Effacement (%): 70 Station: -2 Exam by:: Vadis Slabach CNM Pitocin at 10 mUn/min  Assessment:  Alison Hamilton is a 40 y.o. female, [phone], IUP at 38.1 weeks, presenting for IOL for IUGR, [id number] on 12/1 HC 7%, AC 9%, AFI 11.6, 6.5lbs, vertex, BPP 8/8.  Pt also found to have PCR of 0.358 on 12/1, no elevated BP noted, will monitor for developing preE, pt is asymptomatic. No change over last 4 hours will continue with pitocin, pt feeling mild cxt.  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  Continuous monitoring Rest Ambulate Frequent position changes to facilitate fetal rotation and descent. Will reassess with cervical exam at 4 hours or earlier if necessary Continue pitocin per protocol 2x2 Anticipate labor progression and vaginal delivery.    14/1, NP-C, CNM, MSN 04/04/2021. 7:16 PM

## 2021-04-05 ENCOUNTER — Inpatient Hospital Stay (HOSPITAL_COMMUNITY): Payer: Medicaid Other | Admitting: Anesthesiology

## 2021-04-05 ENCOUNTER — Encounter (HOSPITAL_COMMUNITY): Payer: Self-pay | Admitting: Obstetrics & Gynecology

## 2021-04-05 LAB — RPR: RPR Ser Ql: NONREACTIVE

## 2021-04-05 MED ORDER — FENTANYL-BUPIVACAINE-NACL 0.5-0.125-0.9 MG/250ML-% EP SOLN
12.0000 mL/h | EPIDURAL | Status: DC | PRN
  Administered 2021-04-05: 15:00:00 12 mL/h via EPIDURAL

## 2021-04-05 MED ORDER — OXYCODONE HCL 5 MG PO TABS: 10.0000 mg | ORAL_TABLET | ORAL | Status: DC | PRN

## 2021-04-05 MED ORDER — FENTANYL-BUPIVACAINE-NACL 0.5-0.125-0.9 MG/250ML-% EP SOLN
EPIDURAL | Status: AC
  Filled 2021-04-05: qty 250

## 2021-04-05 MED ORDER — PHENYLEPHRINE 40 MCG/ML (10ML) SYRINGE FOR IV PUSH (FOR BLOOD PRESSURE SUPPORT): 80.0000 ug | PREFILLED_SYRINGE | INTRAVENOUS | Status: DC | PRN

## 2021-04-05 MED ORDER — ACETAMINOPHEN 325 MG PO TABS: 650.0000 mg | ORAL_TABLET | ORAL | Status: DC | PRN

## 2021-04-05 MED ORDER — ZOLPIDEM TARTRATE 5 MG PO TABS: 5.0000 mg | ORAL_TABLET | Freq: Every evening | ORAL | Status: DC | PRN

## 2021-04-05 MED ORDER — COCONUT OIL OIL
1.0000 "application " | TOPICAL_OIL | Status: DC | PRN
  Administered 2021-04-06: 1 via TOPICAL

## 2021-04-05 MED ORDER — BENZOCAINE-MENTHOL 20-0.5 % EX AERO: 1.0000 "application " | INHALATION_SPRAY | CUTANEOUS | Status: DC | PRN

## 2021-04-05 MED ORDER — ONDANSETRON HCL 4 MG/2ML IJ SOLN: 4.0000 mg | INTRAMUSCULAR | Status: DC | PRN

## 2021-04-05 MED ORDER — DIPHENHYDRAMINE HCL 25 MG PO CAPS: 25.0000 mg | ORAL_CAPSULE | Freq: Four times a day (QID) | ORAL | Status: DC | PRN

## 2021-04-05 MED ORDER — DIPHENHYDRAMINE HCL 50 MG/ML IJ SOLN: 12.5000 mg | INTRAMUSCULAR | Status: DC | PRN

## 2021-04-05 MED ORDER — LACTATED RINGERS IV SOLN: 500.0000 mL | Freq: Once | INTRAVENOUS | Status: DC

## 2021-04-05 MED ORDER — ONDANSETRON HCL 4 MG PO TABS: 4.0000 mg | ORAL_TABLET | ORAL | Status: DC | PRN

## 2021-04-05 MED ORDER — WITCH HAZEL-GLYCERIN EX PADS: 1.0000 "application " | MEDICATED_PAD | CUTANEOUS | Status: DC | PRN

## 2021-04-05 MED ORDER — TETANUS-DIPHTH-ACELL PERTUSSIS 5-2.5-18.5 LF-MCG/0.5 IM SUSY: 0.5000 mL | PREFILLED_SYRINGE | Freq: Once | INTRAMUSCULAR | Status: DC

## 2021-04-05 MED ORDER — EPHEDRINE 5 MG/ML INJ: 10.0000 mg | INTRAVENOUS | Status: DC | PRN

## 2021-04-05 MED ORDER — IBUPROFEN 600 MG PO TABS
600.0000 mg | ORAL_TABLET | Freq: Four times a day (QID) | ORAL | Status: DC
  Administered 2021-04-06 (×4): 600 mg via ORAL
  Filled 2021-04-05 (×4): qty 1

## 2021-04-05 MED ORDER — SENNOSIDES-DOCUSATE SODIUM 8.6-50 MG PO TABS
2.0000 | ORAL_TABLET | Freq: Every day | ORAL | Status: DC
  Administered 2021-04-06: 2 via ORAL
  Filled 2021-04-05: qty 2

## 2021-04-05 MED ORDER — DIBUCAINE (PERIANAL) 1 % EX OINT: 1.0000 "application " | TOPICAL_OINTMENT | CUTANEOUS | Status: DC | PRN

## 2021-04-05 MED ORDER — OXYCODONE HCL 5 MG PO TABS: 5.0000 mg | ORAL_TABLET | ORAL | Status: DC | PRN

## 2021-04-05 MED ORDER — PRENATAL MULTIVITAMIN CH
1.0000 | ORAL_TABLET | Freq: Every day | ORAL | Status: DC
  Administered 2021-04-06: 1 via ORAL
  Filled 2021-04-05: qty 1

## 2021-04-05 MED ORDER — SIMETHICONE 80 MG PO CHEW: 80.0000 mg | CHEWABLE_TABLET | ORAL | Status: DC | PRN

## 2021-04-05 MED ORDER — LIDOCAINE HCL (PF) 1 % IJ SOLN
INTRAMUSCULAR | Status: DC | PRN
  Administered 2021-04-05 (×2): 5 mL via EPIDURAL

## 2021-04-05 NOTE — Progress Notes (Signed)
Epidural was just placed. CNM to return in 1 hr for SVE after pt is comfortable. Cat 1 fetal surveillance prior ro epidural placement.   Rhea Pink, MSN, CNM 04/05/2021 3:05 PM

## 2021-04-05 NOTE — Progress Notes (Signed)
Subjective:    Doing well, SVE performed by RN.  Objective:    VS: BP 117/72   Pulse 97   Temp 97.9 F (36.6 C) (Oral)   Resp 16   Ht 5\' 7"  (1.702 m)   Wt 79.4 kg   LMP 06/16/2020   BMI 27.41 kg/m  FHR : baseline 125 / variability moderate / accelerations present / early decelerations Toco: contractions every 2-5 minutes  Membranes: AROM x4 hrs Dilation: 5 Effacement (%): 70 Cervical Position: Anterior Station: -2 Presentation: Vertex Exam by:: [email or web address], RN Pitocin 12 mU/min  Assessment/Plan:   40 y.o. 42 [redacted]w[redacted]d  Labor: Progressing normally on PItoin after AROM Fetal Wellbeing:  Category I Pain Control:  IV pain meds I/D:   GBS neg Anticipated MOD:  NSVD  [redacted]w[redacted]d MSN, CNM 04/05/2021 1:13 PM

## 2021-04-05 NOTE — Progress Notes (Signed)
Labor Progress Note  Alison Hamilton is a 40 y.o. female, [phone], IUP at 38.1 weeks, presenting for IOL for IUGR, [place] on 12/1 HC 7%, AC 9%, AFI 11.6, 6.5lbs, vertex, BPP 8/8.  Pt also found to have PCR of 0.358 on 12/1, no elevated BP noted, will monitor for developing preE, pt is asymptomatic, denies HA, RUQ pain or vision changes  Subjective: Pt stable in bed in NAD. Partner at bedside and supportive. Pt able to sleep off and on. Pt endorses feeling mild cxt.  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 No intake/output data recorded. NST: FHR baseline 135 bpm, Variability: moderate, Accelerations:present, Decelerations: variables= Cat 2/Reactive CTX:  irregular, every 3-10 minutes Uterus gravid, soft non tender, moderate to palpate with contractions.  SVE:  Dilation: 2.5 Effacement (%): 70 Station: -3 Exam by:: L. [email or web address], RN Pitocin at 14 mUn/min  Assessment:  Alison Hamilton is a 40 y.o. female, [phone], IUP at 38.1 weeks, presenting for IOL for IUGR, [id number] on 12/1 HC 7%, AC 9%, AFI 11.6, 6.5lbs, vertex, BPP 8/8.  Pt also found to have PCR of 0.358 on 12/1, no elevated BP noted, will monitor for developing preE, pt is asymptomatic. No change over last 4 hours will continue with pitocin, pt feeling mild cxt.  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  Continuous monitoring Rest Ambulate Frequent position changes to facilitate fetal rotation and descent. Will reassess with cervical exam at 4 hours or earlier if necessary Continue pitocin per protocol 2x2 Anticipate labor progression and vaginal delivery.    14/1, NP-C, CNM, MSN 04/05/2021. 12:43 AM

## 2021-04-05 NOTE — Progress Notes (Signed)
Subjective:    Comfortable w/ epidural.   Objective:    VS: BP 132/83   Pulse 80   Temp 97.9 F (36.6 C) (Oral)   Resp 18   Ht 5\' 7"  (1.702 m)   Wt 79.4 kg   LMP 06/16/2020   SpO2 98%   BMI 27.41 kg/m  FHR : baseline 125 / variability moderate / accelerations present / absent decelerations Toco: contractions every 2-4 minutes  Membranes: AROM x7 hrs, remains clear Dilation: 5 Effacement (%): 70 Cervical Position: Anterior Station: -2 Presentation: Vertex Exam by:: [email or web address], RN Pitocin 12 mU/min  Assessment/Plan:   40 y.o. 42 [redacted]w[redacted]d  Labor:  Slow progression IUPC placed  for Pitocin titration Fetal Wellbeing:  Category I Pain Control:  Epidural I/D:   GBS neg Anticipated MOD:  NSVD  [redacted]w[redacted]d MSN, CNM 04/05/2021 4:41 PM

## 2021-04-05 NOTE — Progress Notes (Signed)
Subjective:    Discussed amniotomy and pt agrees. Pt is nsure about pain mangement in active labor.   Objective:    VS: BP 119/77   Pulse 82   Temp 97.9 F (36.6 C) (Oral)   Resp 16   Ht 5\' 7"  (1.702 m)   Wt 79.4 kg   LMP 06/16/2020   BMI 27.41 kg/m  FHR : baseline 135 / variability moderate / accelerations present / absent decelerations Toco: contractions every 2 minutes / Membranes: AROM, clear, copious fluid Dilation: 3 Effacement (%): 70 Cervical Position: Anterior Station: -2 Presentation: Vertex Exam by:: [email or web address], CNM Pitocin 18 reduced to 10 mU/min after AROM  Assessment/Plan:   40 y.o. 45 [redacted]w[redacted]d  Labor:  Pitocin x 20 hrs w/ minimal change, AROM performed and Pitocin reduced from 18 to 10 mU/min, will observe for tachysystole and adjust Pitocin  Fetal Wellbeing:  Category I Pain Control:   unsure I/D:  n/a Anticipated MOD:  NSVD  [redacted]w[redacted]d MSN, CNM 04/05/2021 8:41 AM

## 2021-04-05 NOTE — Anesthesia Procedure Notes (Signed)
Epidural Patient location during procedure: OB Start time: 04/05/2021 2:45 PM End time: 04/05/2021 2:55 PM  Staffing Anesthesiologist: Achille Rich, MD Performed: anesthesiologist   Preanesthetic Checklist Completed: patient identified, IV checked, site marked, risks and benefits discussed, monitors and equipment checked, pre-op evaluation and timeout performed  Epidural Patient position: sitting Prep: DuraPrep Patient monitoring: heart rate, cardiac monitor, continuous pulse ox and blood pressure Approach: midline Location: L2-L3 Injection technique: LOR saline  Needle:  Needle type: Tuohy  Needle gauge: 17 G Needle length: 9 cm Needle insertion depth: 6 cm Catheter type: closed end flexible Catheter size: 19 Gauge Catheter at skin depth: 12 cm Test dose: negative and Other  Assessment Events: blood not aspirated, injection not painful, no injection resistance and negative IV test  Additional Notes Informed consent obtained prior to proceeding including risk of failure, 1% risk of PDPH, risk of minor discomfort and bruising.  Discussed rare but serious complications including epidural abscess, permanent nerve injury, epidural hematoma.  Discussed alternatives to epidural analgesia and patient desires to proceed.  Timeout performed pre-procedure verifying patient name, procedure, and platelet count.  Patient tolerated procedure well. Reason for block:procedure for pain

## 2021-04-05 NOTE — Anesthesia Preprocedure Evaluation (Signed)
Anesthesia Evaluation  Patient identified by MRN, date of birth, ID band Patient awake    Reviewed: Allergy & Precautions, H&P , NPO status , Patient's Chart, lab work & pertinent test results  Airway Mallampati: II   Neck ROM: full    Dental   Pulmonary Current Smoker,    breath sounds clear to auscultation       Cardiovascular negative cardio ROS   Rhythm:regular Rate:Normal     Neuro/Psych    GI/Hepatic   Endo/Other    Renal/GU      Musculoskeletal   Abdominal   Peds  Hematology  (+) anemia ,   Anesthesia Other Findings   Reproductive/Obstetrics (+) Pregnancy                             Anesthesia Physical Anesthesia Plan  ASA: 2  Anesthesia Plan: Epidural   Post-op Pain Management:    Induction: Intravenous  PONV Risk Score and Plan: 1 and Treatment may vary due to age or medical condition  Airway Management Planned:   Additional Equipment:   Intra-op Plan:   Post-operative Plan:   Informed Consent: I have reviewed the patients History and Physical, chart, labs and discussed the procedure including the risks, benefits and alternatives for the proposed anesthesia with the patient or authorized representative who has indicated his/her understanding and acceptance.     Dental advisory given  Plan Discussed with: Anesthesiologist  Anesthesia Plan Comments:         Anesthesia Quick Evaluation

## 2021-04-05 NOTE — Lactation Note (Signed)
This note was copied from a baby's chart. Lactation Consultation Note  Patient Name: Alison Hamilton Date: 04/05/2021 Reason for consult: L&D Initial assessment;Mother's request;Difficult latch;1st time breastfeeding;Early term 37-38.6wks;Breastfeeding assistance Age:42 hours Mom assisted with latching, infant only licking and tasting but not latching. RN lowering Mom to do a fundal rup. Mom encouraged to keep infant STS near to breast, lick and taste colostrum. Mom to receive further LC support on the floor.   Maternal Data    Feeding Mother's Current Feeding Choice: Breast Milk  LATCH Score                    Lactation Tools Discussed/Used    Interventions Interventions: Breast feeding basics reviewed;Assisted with latch;Skin to skin;Breast massage;Hand express;Breast compression;Adjust position;Education  Discharge    Consult Status Consult Status: Follow-up from L&D Date: 04/06/21 Follow-up type: In-patient    Alison Aloi  Hamilton 04/05/2021, 9:23 PM

## 2021-04-05 NOTE — Progress Notes (Addendum)
Labor Progress Note  Alison Hamilton is a 40 y.o. female, [phone], IUP at 38.1 weeks, presenting for IOL for IUGR, [place] on 12/1 HC 7%, AC 9%, AFI 11.6, 6.5lbs, vertex, BPP 8/8.  Pt also found to have PCR of 0.358 on 12/1, no elevated BP noted, will monitor for developing preE, pt is asymptomatic, denies HA, RUQ pain or vision changes  Subjective: Pt stable in bed in NAD. Pt able to sleep off and on. Pt endorses feeling mild cxt. Tolerates well. 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 No intake/output data recorded. NST: FHR baseline 135 bpm, Variability: moderate, Accelerations:present, Decelerations: absent= Cat 1/Reactive CTX:  irregular, every 3-10 minutes Uterus gravid, soft non tender, moderate to palpate with contractions.  SVE:  Dilation: 2.5 Effacement (%): 60, 70 Station: -3 Exam by:: [email or web address], RN Pitocin at 18 mUn/min  Assessment:  Alison Hamilton is a 40 y.o. female, 45, IUP at 38.1 weeks, presenting for IOL for IUGR, [id number] on 12/1 HC 7%, AC 9%, AFI 11.6, 6.5lbs, vertex, BPP 8/8.  Pt also found to have PCR of 0.358 on 12/1, no elevated BP noted, will monitor for developing preE, pt is asymptomatic. No change over last 4 hours will continue with pitocin, pt feeling mild cxt.  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  Continuous monitoring Rest Ambulate Frequent position changes to facilitate fetal rotation and descent. Will reassess with cervical exam at 4 hours or earlier if necessary Continue pitocin per protocol 2x2 Anticipate labor progression and vaginal delivery.   14/1 CNM and MD Rhea Pink to assume care at 0700 and will be given report.   Su Hilt, NP-C, CNM, MSN 04/05/2021. 5:36 AM

## 2021-04-06 LAB — CBC
HCT: 26.5 % — ABNORMAL LOW (ref 36.0–46.0)
Hemoglobin: 9.3 g/dL — ABNORMAL LOW (ref 12.0–15.0)
MCH: 33.3 pg (ref 26.0–34.0)
MCHC: 35.1 g/dL (ref 30.0–36.0)
MCV: 95 fL (ref 80.0–100.0)
Platelets: 206 K/uL (ref 150–400)
RBC: 2.79 MIL/uL — ABNORMAL LOW (ref 3.87–5.11)
RDW: 13.2 % (ref 11.5–15.5)
WBC: 21.7 K/uL — ABNORMAL HIGH (ref 4.0–10.5)
nRBC: 0 % (ref 0.0–0.2)

## 2021-04-06 MED ORDER — OXYCODONE-ACETAMINOPHEN 5-325 MG PO TABS: 1.0000 | ORAL_TABLET | Freq: Four times a day (QID) | ORAL | 0 refills | Status: DC | PRN

## 2021-04-06 MED ORDER — IBUPROFEN 600 MG PO TABS: 600.0000 mg | ORAL_TABLET | Freq: Four times a day (QID) | ORAL | 2 refills | Status: DC | PRN

## 2021-04-06 NOTE — Discharge Summary (Signed)
[facility] Discharge Summary   Patient Name:   Alison Hamilton DOB:     Apr 22, 1981 MRN:     [id number]  Date of Admission:   04/04/2021 Date of Discharge:  04/06/2021  Admitting diagnosis:    Encounter for induction of labor [Z34.90] Principal Problem:   Encounter for induction of labor  Term Pregnancy Delivered and IUGR    Discharge diagnosis:    Encounter for induction of labor [Z34.90] Principal Problem:   Encounter for induction of labor  Term Pregnancy Delivered, Anemia, and IUGR         Additional problems: n/a    Post partum procedures: none Augmentation: AROM and Pitocin Complications: None  Hospital course: Induction of Labor With Vaginal Delivery   40 y.o. yo [phone] at [redacted]w[redacted]d was admitted to the hospital 04/04/2021 for induction of labor.  Indication for induction:  Fetal growth restriction .  Patient had an uncomplicated labor course as follows: Membrane Rupture Time/Date: 8:35 AM ,04/05/2021   Delivery Method:Vaginal, Spontaneous  Episiotomy: None  Lacerations:  None  Details of delivery can be found in separate delivery note.  Patient had a routine postpartum course. Patient is discharged home 04/06/21.  Newborn Data: Birth date:04/05/2021  Birth time:8:52 PM  Gender:Female  Living status:Living  Apgars:9 ,9  Weight:2760 g  Newborn Delivery   Birth date/time: 04/05/2021 20:52:00 Delivery type: Vaginal, Spontaneous    Magnesium Sulfate received: No BMZ received: No Rhophylac:No MMR:No T-DaP:Given prenatally Flu: UTD Transfusion:No                                                               Type of Delivery:  NSVD  Delivering Provider: Domingo Pulse   Date of Delivery:  04/06/21  Baby Feeding:   Bottle and Breast Disposition:   home with mother  Physical Exam:   Vitals:   04/06/21 0037 04/06/21 0413 04/06/21 0830 04/06/21 1209  BP: 116/77 120/70 116/74 119/72  Pulse: 81  75 77  Resp: $Remo'18 17 18 17  'HtjTQ$ Temp: 98.4 F  (36.9 C) 98.1 F (36.7 C) 97.8 F (36.6 C) 98.1 F (36.7 C)  TempSrc: Oral Oral Oral Oral  SpO2: 99% 99%    Weight:      Height:       General: alert, cooperative, and no distress Lochia: appropriate Uterine Fundus: firm Incision: N/A DVT Evaluation: No evidence of DVT seen on physical exam. Negative Homan's sign. No cords or calf tenderness.  Labs: Lab Results  Component Value Date   WBC 21.7 (H) 04/06/2021   HGB 9.3 (L) 04/06/2021   HCT 26.5 (L) 04/06/2021   MCV 95.0 04/06/2021   PLT 206 04/06/2021   CMP Latest Ref Rng & Units 04/04/2021  Glucose 70 - 99 mg/dL 80  BUN 6 - 20 mg/dL 6  Creatinine 0.44 - 1.00 mg/dL 0.74  Sodium 135 - 145 mmol/L 134(L)  Potassium 3.5 - 5.1 mmol/L 3.9  Chloride 98 - 111 mmol/L 107  CO2 22 - 32 mmol/L 20(L)  Calcium 8.9 - 10.3 mg/dL 9.2  Total Protein 6.5 - 8.1 g/dL 6.0(L)  Total Bilirubin 0.3 - 1.2 mg/dL 0.6  Alkaline Phos 38 - 126 U/L 105  AST 15 - 41 U/L 15  ALT 0 - 44 U/L 8  Discharge instruction: per After Visit Summary and "Baby and Me Booklet".  After Visit Meds:  Allergies as of 04/06/2021   No Known Allergies      Medication List     STOP taking these medications    acetaminophen 500 MG tablet Commonly known as: TYLENOL   aspirin EC 81 MG tablet   metroNIDAZOLE 500 MG tablet Commonly known as: FLAGYL       TAKE these medications    cyclobenzaprine 10 MG tablet Commonly known as: FLEXERIL Take 1 tablet (10 mg total) by mouth 3 (three) times daily as needed for muscle spasms.   ibuprofen 600 MG tablet Commonly known as: ADVIL Take 1 tablet (600 mg total) by mouth every 6 (six) hours as needed for cramping.   multivitamin-prenatal 27-0.8 MG Tabs tablet Take 1 tablet by mouth daily at 12 noon.   oxyCODONE-acetaminophen 5-325 MG tablet Commonly known as: PERCOCET/ROXICET Take 1 tablet by mouth every 6 (six) hours as needed for severe pain.   promethazine 25 MG tablet Commonly known as:  PHENERGAN Take 1 tablet (25 mg total) by mouth every 6 (six) hours as needed for nausea or vomiting.        Diet: routine diet  Activity: Advance as tolerated. Pelvic rest for 6 weeks.   Outpatient follow up:6 weeks Follow up Appt:No future appointments. Follow up visit: No follow-ups on file.  Postpartum contraception: Undecided  04/06/2021 Sanjuana Kava, MD

## 2021-04-06 NOTE — Progress Notes (Signed)
MD PROGRESS NOTE  Alison Hamilton [id number]  PPD# 1 SVD w/ Selena Batten Information for the patient's newborn:  Ortha, Metts [[id number]]  female    Subjective:   Reports feeling good, no complaints Tolerating PO fluid and solids No nausea or vomiting Bleeding is light Pain controlled with ibuprofen (OTC) Up ad lib / ambulatory / voiding w/o difficulty Feeding: Bottle and Breast    Objective:   VS: BP 119/72 (BP Location: Right Arm)   Pulse 77   Temp 98.1 F (36.7 C) (Oral)   Resp 17   Ht 5\' 7"  (1.702 m)   Wt 79.4 kg   LMP 06/16/2020   SpO2 99%   Breastfeeding Unknown   BMI 27.41 kg/m   Physical Exam: Alert and oriented X3 Lungs: Clear and unlabored Heart: regular rate and rhythm / no mumurs Abdomen: soft, non-tender, non-distended  Fundus: firm, non-tender, U-1 Perineum: intact Lochia: minimal Extremities: No edema, no calf pain, tenderness, or cords  Labs:  Recent Labs    04/04/21 1213 04/06/21 0422  WBC 12.0* 21.7*  HGB 10.7* 9.3*  PLT 275 206   Blood type: --/--/A POS (12/03 1213) Rubella: Immune (06/09 0000)                     Assessment:  PPD # s/p NSVD doing well   Plan:  Continue routine post partum care Circumcision done today Anticipate early D/C today    10-25-1998, MD 04/06/2021, 12:19 PM

## 2021-04-06 NOTE — Anesthesia Postprocedure Evaluation (Signed)
Anesthesia Post Note  Patient: Alison Hamilton  Procedure(s) Performed: AN AD HOC LABOR EPIDURAL     Patient location during evaluation: Mother Baby Anesthesia Type: Epidural Level of consciousness: awake and alert and oriented Pain management: satisfactory to patient Vital Signs Assessment: post-procedure vital signs reviewed and stable Respiratory status: respiratory function stable Cardiovascular status: stable Postop Assessment: no headache, no backache, epidural receding, patient able to bend at knees, no signs of nausea or vomiting, adequate PO intake and able to ambulate Anesthetic complications: no   No notable events documented.  Last Vitals:  Vitals:   04/06/21 0830 04/06/21 1209  BP: 116/74 119/72  Pulse: 75 77  Resp: 18 17  Temp: 36.6 C 36.7 C  SpO2:      Last Pain:  Vitals:   04/06/21 1503  TempSrc:   PainSc: 0-No pain   Pain Goal:                   Witten Certain

## 2021-04-06 NOTE — Lactation Note (Signed)
This note was copied from a baby's chart. Lactation Consultation Note  Patient Name: Alison Hamilton [id number]'E Date: 04/06/2021 Reason for consult: 1st time breastfeeding;Early term 37-38.6wks;Infant weight loss;Other (Comment);Nipple pain/trauma (2 % weight loss P3 , 1st time BF) Age:42 hours Perm om baby has attempted to latch / nipples and areolas are tender.  Per mom has been leaking since 6 months and after delivery.  LC offered to assist / mom receptive / baby was mouthing breast tissue / no latch. Supplemented with feeding and LC instructed mom how to pace feed / baby took 15 ml/ tolerated well.  LC plan :  Continue to offer breast  Breast shells between feedings except when sleeping  Pre pump with hand pump / attempt to latch.  If baby doesn't latch feed supplement of EBM or formula .  Post pump both breast for 15 mins / save milk for the next feeding.   Mom aware latching is going to take time.   Maternal Data Has patient been taught Hand Expression?: Yes Does the patient have breastfeeding experience prior to this delivery?: No  Feeding Mother's Current Feeding Choice: Breast Milk and Formula Nipple Type: Slow - flow  LATCH Score Latch: Too sleepy or reluctant, no latch achieved, no sucking elicited.  Audible Swallowing: None  Type of Nipple: Everted at rest and after stimulation (tender / swollen areolas)  Comfort (Breast/Nipple): Soft / non-tender  Hold (Positioning): Assistance needed to correctly position infant at breast and maintain latch.  LATCH Score: 5   Lactation Tools Discussed/Used    Interventions Interventions: Breast feeding basics reviewed;Assisted with latch;Skin to skin;Breast massage;Hand express;Reverse pressure;Breast compression;Adjust position;Support pillows;Position options;Shells;DEBP;Education;LC Services brochure  Discharge Pump: Personal;DEBP WIC Program: No  Consult Status Consult Status: Follow-up Date: 04/06/21 Follow-up  type: In-patient    Alison Hamilton 04/06/2021, 8:56 AM

## 2021-04-07 ENCOUNTER — Ambulatory Visit: Payer: Self-pay

## 2021-04-07 NOTE — Lactation Note (Signed)
This note was copied from a baby's chart. Lactation Consultation Note  Patient Name: Alison Hamilton [id number] Date: 04/07/2021 Reason for consult: Follow-up assessment;Early term 37-38.6wks;Infant weight loss;Other (Comment) (5 % weight loss / for D/C today/ per mom has attempted to latch / still difficult / pumped x 3 yesterday and once this am. see LC note) per mom breast are feeling fuller today, LC reassured her that is a good sign.  Age:42 hours LC reviewed supply and demand / reminded mom to wear the shells between feedings except when sleeping to elongate the nipple / areola complex ,  And keep working on the latching / if baby doesn't latch / feed from the bottle pace feeding and keep the consistent pumping going around the baby's feedings times until the baby is latching.  LC offered to request a LC F/U O/P and mom will check with the [facility] for appt.  LC reviewed BF D/C teaching.   Maternal Data    Feeding Mother's Current Feeding Choice: Breast Milk and Formula Nipple Type: Slow - flow  LATCH Score                    Lactation Tools Discussed/Used Tools: Shells;Pump;Flanges Flange Size: 24 Breast pump type: Manual;Double-Electric Breast Pump Pump Education: Milk Storage;Setup, frequency, and cleaning Pumping frequency: per mom 3 times yesterday and once today .  Interventions Interventions: Breast feeding basics reviewed;Education;LC Services brochure  Discharge Discharge Education: Engorgement and breast care;Warning signs for feeding baby Pump: Personal;DEBP  Consult Status Consult Status: Complete Date: 04/07/21    Alison Hamilton 04/07/2021, 9:54 AM

## 2021-04-20 ENCOUNTER — Telehealth (HOSPITAL_COMMUNITY): Payer: Self-pay | Admitting: *Deleted

## 2021-04-20 NOTE — Telephone Encounter (Signed)
Mom reports feeling good physically, but emotionally having anxiety and sadness. She has made an appt for 12/28 with [facility]. EPDS=17 [facility] score=3) Dr. Su Hilt faxed scores. Mom reports baby is doing well. Feeding, peeing, and pooping without difficulty. Safe sleep reviewed. Mom reports no concerns about baby at present.  Duffy Rhody, RN 04-20-2021 at 449pm

## 2021-05-01 IMAGING — MG MM DIGITAL SCREENING BILAT W/ TOMO AND CAD
8 series · 9 of 24 positions shown · non-contrast
Comparison: None.

CLINICAL DATA: Screening. Baseline.

EXAM:
DIGITAL SCREENING BILATERAL MAMMOGRAM WITH TOMOSYNTHESIS AND CAD
TECHNIQUE: Bilateral screening digital craniocaudal and mediolateral oblique
mammograms were obtained. Bilateral screening digital breast
tomosynthesis was performed. The images were evaluated with
computer-aided detection.

[R MLO synth-2D]
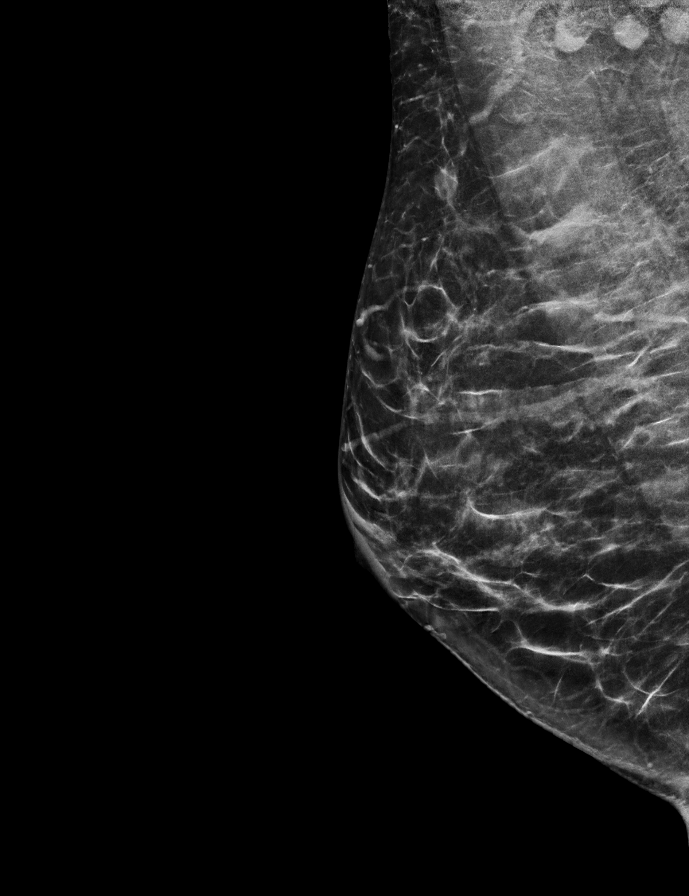

[L MLO synth-2D]
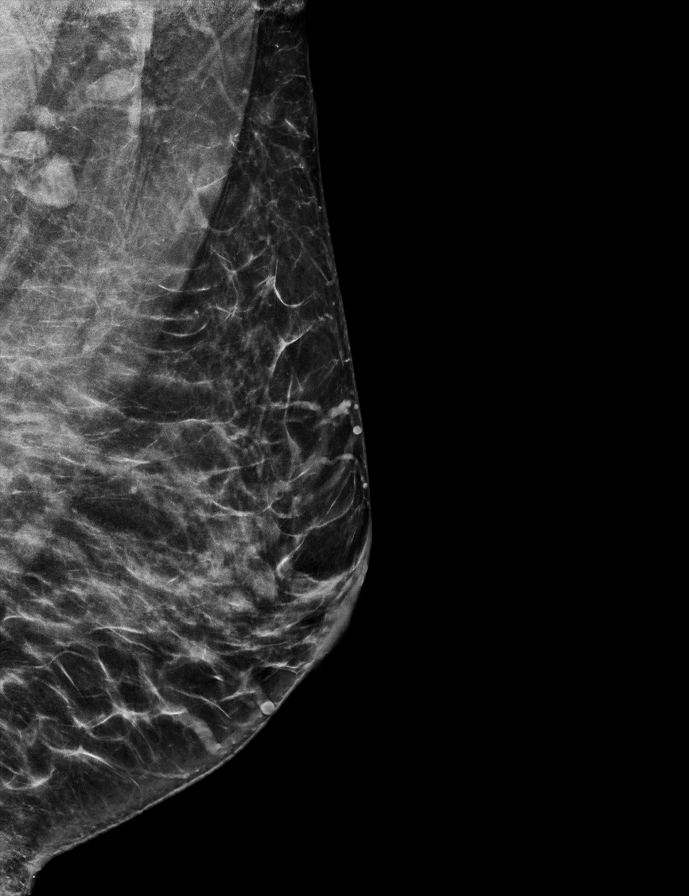

[L CC synth-2D]
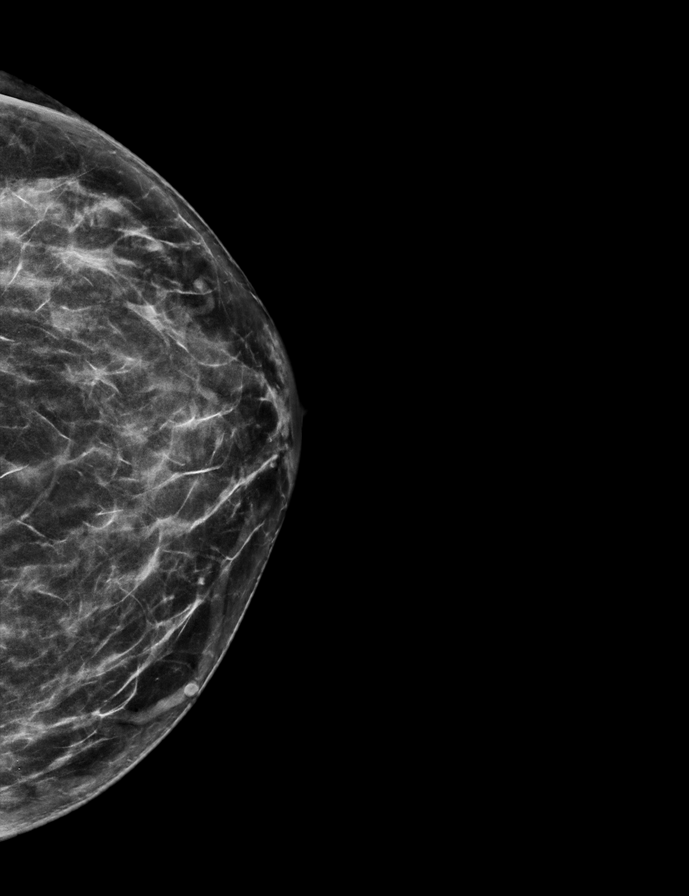

[R CC synth-2D]
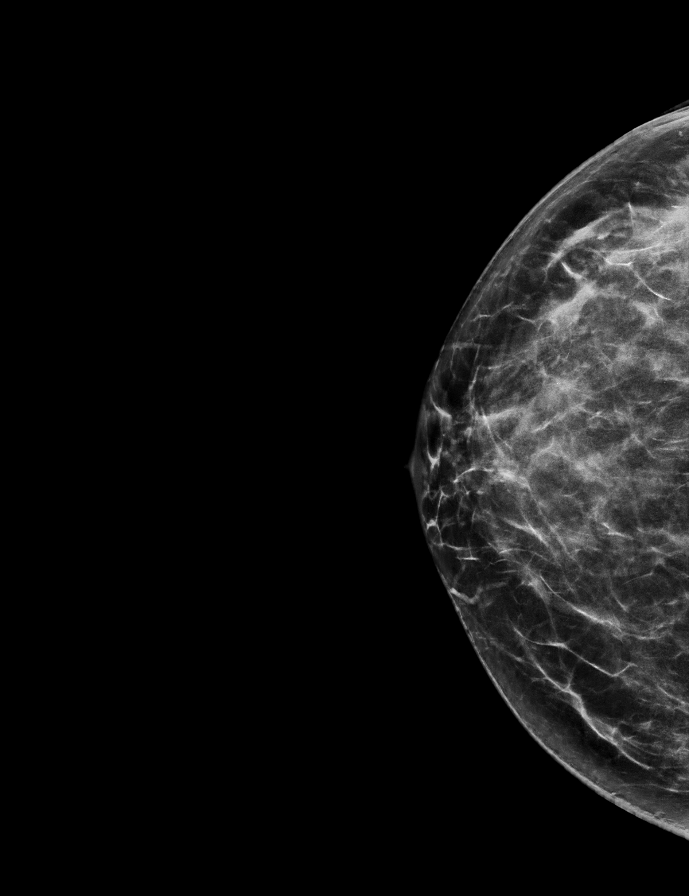

[R MLO tomo · 2 of 63 frames shown]
[frame 21/63]
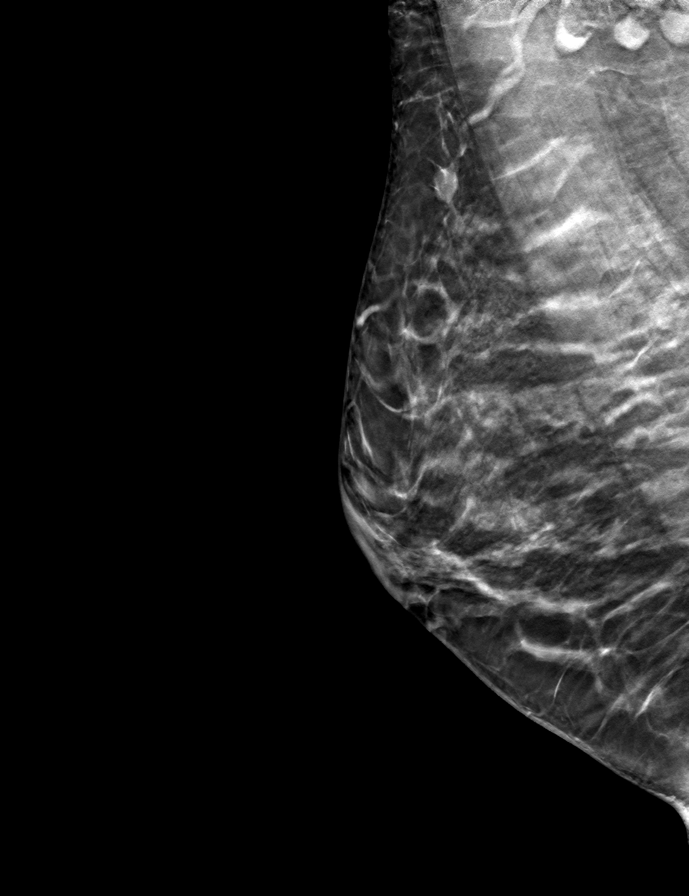
[frame 32/63]
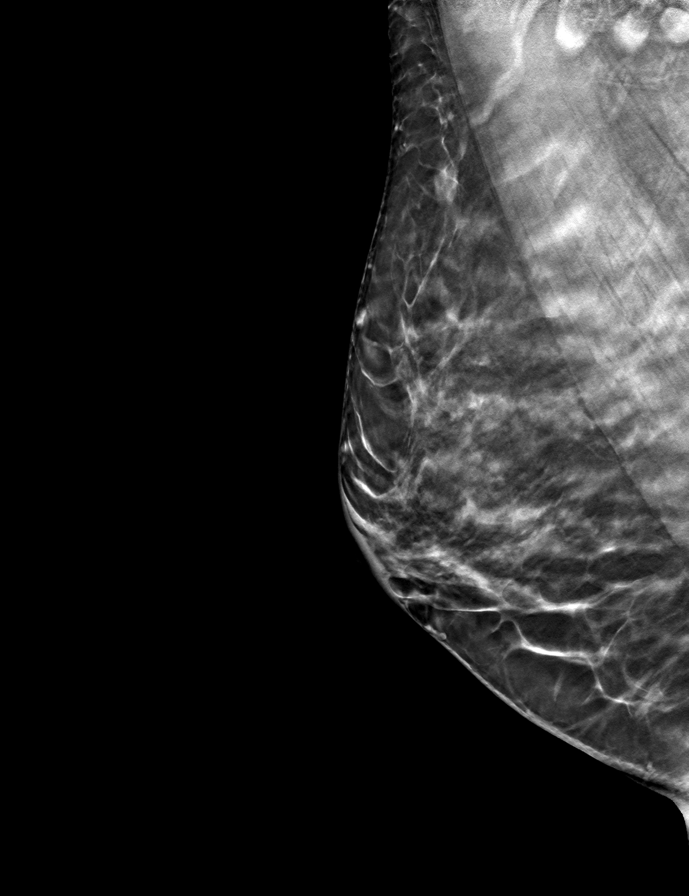

[R CC tomo · tomo slice 31/62.0]
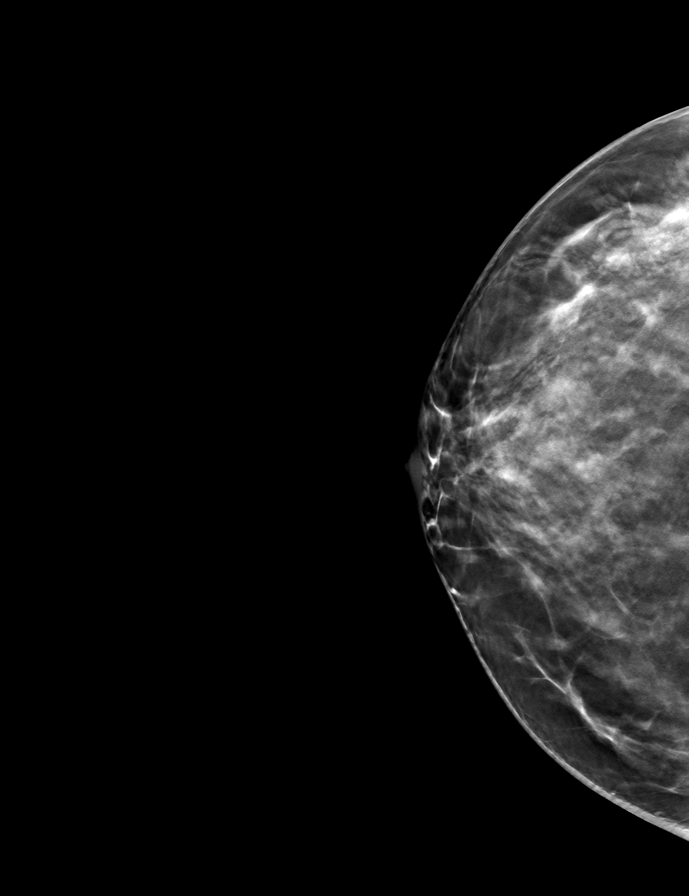

[L CC tomo · tomo slice 32/63.0]
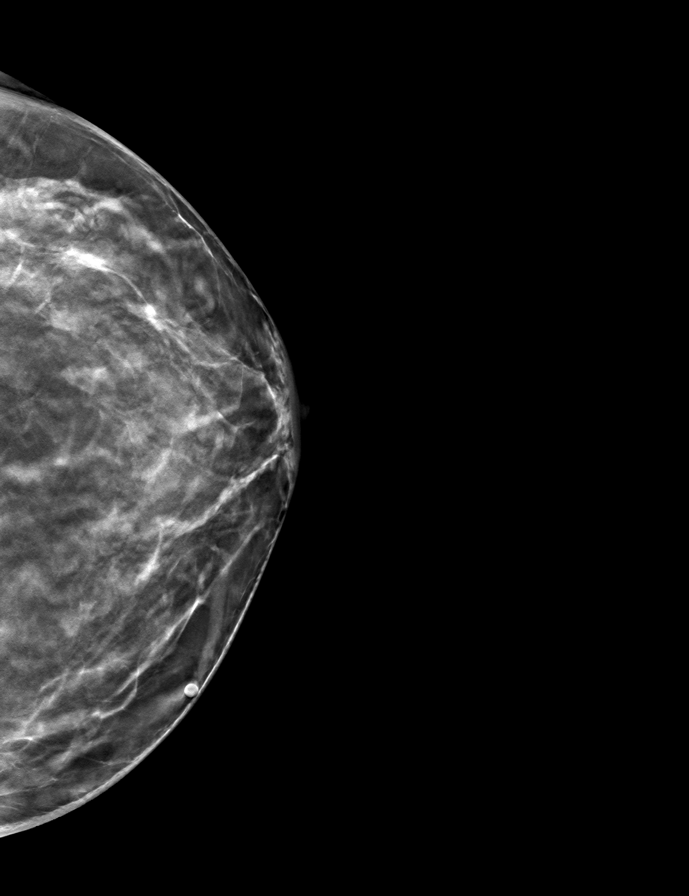

[L MLO tomo · tomo slice 35/68.0]
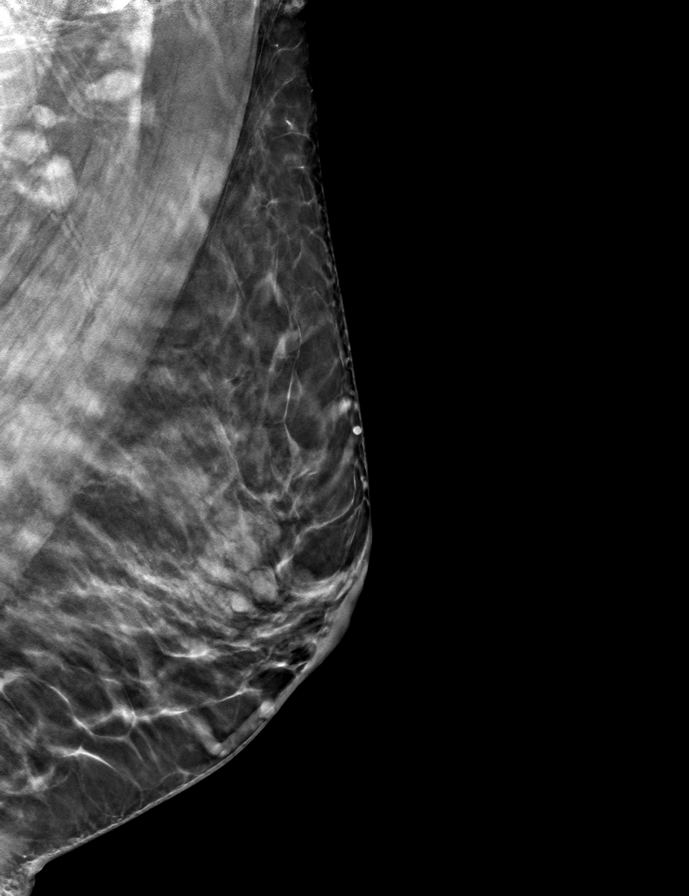

[9 of 24 positions shown; findings below may reference images not displayed]

ACR Breast Density Category c: The breast tissue is heterogeneously
dense, which may obscure small masses
FINDINGS: There are no findings suspicious for malignancy. The images were
evaluated with computer-aided detection.
IMPRESSION: No mammographic evidence of malignancy. A result letter of this
screening mammogram will be mailed directly to the patient.

RECOMMENDATION:
Screening mammogram in one year. (Code:[id number])

BI-RADS CATEGORY  1: Negative.

## 2021-05-25 ENCOUNTER — Other Ambulatory Visit: Payer: Self-pay | Admitting: Obstetrics and Gynecology

## 2021-06-22 ENCOUNTER — Encounter (HOSPITAL_BASED_OUTPATIENT_CLINIC_OR_DEPARTMENT_OTHER): Payer: Self-pay | Admitting: Obstetrics and Gynecology

## 2021-06-22 ENCOUNTER — Other Ambulatory Visit: Payer: Self-pay

## 2021-06-22 NOTE — Progress Notes (Signed)
Spoke w/ via phone for pre-op interview--- pt Lab needs dos----  cbc, t&s, urine preg             Lab results------ no COVID test -----patient states asymptomatic no test needed Arrive at ------- 1230 on 06-25-2021 NPO after MN NO Solid Food.  Clear liquids from MN until--- 1130 Med rec completed Medications to take morning of surgery ----- zoloft, valtrex Diabetic medication ----- n/a Patient instructed no nail polish to be worn day of surgery Patient instructed to bring photo id and insurance card day of surgery Patient aware to have Driver (ride ) / caregiver  for 24 hours after surgery --fiance, darryl jarrell Patient Special Instructions ----- n/a Pre-Op special Istructions ----- n/a Patient verbalized understanding of instructions that were given at this phone interview. Patient denies shortness of breath, chest pain, fever, cough at this phone interview.

## 2021-06-23 ENCOUNTER — Other Ambulatory Visit: Payer: Self-pay | Admitting: Obstetrics and Gynecology

## 2021-06-24 DIAGNOSIS — M5416 Radiculopathy, lumbar region: Secondary | ICD-10-CM | POA: Insufficient documentation

## 2021-06-25 ENCOUNTER — Encounter (HOSPITAL_BASED_OUTPATIENT_CLINIC_OR_DEPARTMENT_OTHER): Payer: Self-pay | Admitting: Obstetrics and Gynecology

## 2021-06-25 ENCOUNTER — Ambulatory Visit (HOSPITAL_BASED_OUTPATIENT_CLINIC_OR_DEPARTMENT_OTHER): Payer: Medicaid Other | Admitting: Anesthesiology

## 2021-06-25 ENCOUNTER — Encounter (HOSPITAL_BASED_OUTPATIENT_CLINIC_OR_DEPARTMENT_OTHER)
Admission: RE | Disposition: A | Discharge status: Home / Self Care | Payer: Self-pay | Source: Ambulatory Visit | Attending: Obstetrics and Gynecology

## 2021-06-25 ENCOUNTER — Ambulatory Visit (HOSPITAL_BASED_OUTPATIENT_CLINIC_OR_DEPARTMENT_OTHER)
Admission: RE | Admit: 2021-06-25 | Discharge: 2021-06-25 | Disposition: A | Discharge status: Home / Self Care | Payer: Medicaid Other | Source: Ambulatory Visit | Attending: Obstetrics and Gynecology | Admitting: Obstetrics and Gynecology

## 2021-06-25 ENCOUNTER — Other Ambulatory Visit: Payer: Self-pay

## 2021-06-25 DIAGNOSIS — Z302 Encounter for sterilization: Secondary | ICD-10-CM | POA: Diagnosis present

## 2021-06-25 DIAGNOSIS — N920 Excessive and frequent menstruation with regular cycle: Secondary | ICD-10-CM | POA: Diagnosis not present

## 2021-06-25 DIAGNOSIS — Z79899 Other long term (current) drug therapy: Secondary | ICD-10-CM | POA: Insufficient documentation

## 2021-06-25 DIAGNOSIS — Z8616 Personal history of COVID-19: Secondary | ICD-10-CM | POA: Insufficient documentation

## 2021-06-25 DIAGNOSIS — F17210 Nicotine dependence, cigarettes, uncomplicated: Secondary | ICD-10-CM | POA: Insufficient documentation

## 2021-06-25 HISTORY — DX: Anemia, unspecified: D64.9

## 2021-06-25 HISTORY — DX: Herpesviral infection of urogenital system, unspecified: A60.00

## 2021-06-25 HISTORY — DX: Personal history of COVID-19: Z86.16

## 2021-06-25 HISTORY — PX: LAPAROSCOPIC BILATERAL SALPINGECTOMY: SHX5889

## 2021-06-25 HISTORY — DX: Other psychoactive substance abuse, in remission: F19.11

## 2021-06-25 HISTORY — DX: Presence of dental prosthetic device (complete) (partial): Z97.2

## 2021-06-25 HISTORY — DX: Presence of spectacles and contact lenses: Z97.3

## 2021-06-25 HISTORY — DX: Generalized anxiety disorder: F41.1

## 2021-06-25 HISTORY — PX: HYSTEROSCOPY WITH D & C: SHX1775

## 2021-06-25 LAB — POCT PREGNANCY, URINE: Preg Test, Ur: NEGATIVE

## 2021-06-25 LAB — TYPE AND SCREEN
ABO/RH(D): A POS
Antibody Screen: NEGATIVE

## 2021-06-25 LAB — CBC
HCT: 39.7 % (ref 36.0–46.0)
Hemoglobin: 13.7 g/dL (ref 12.0–15.0)
MCH: 30.2 pg (ref 26.0–34.0)
MCHC: 34.5 g/dL (ref 30.0–36.0)
MCV: 87.4 fL (ref 80.0–100.0)
Platelets: 314 10*3/uL (ref 150–400)
RBC: 4.54 MIL/uL (ref 3.87–5.11)
RDW: 14.9 % (ref 11.5–15.5)
WBC: 9.2 10*3/uL (ref 4.0–10.5)
nRBC: 0 % (ref 0.0–0.2)

## 2021-06-25 SURGERY — SALPINGECTOMY, BILATERAL, LAPAROSCOPIC
Anesthesia: General | Site: Vagina

## 2021-06-25 MED ORDER — FENTANYL CITRATE (PF) 100 MCG/2ML IJ SOLN
INTRAMUSCULAR | Status: AC
  Filled 2021-06-25: qty 2

## 2021-06-25 MED ORDER — FENTANYL CITRATE (PF) 100 MCG/2ML IJ SOLN
25.0000 ug | INTRAMUSCULAR | Status: DC | PRN
  Administered 2021-06-25: 25 ug via INTRAVENOUS

## 2021-06-25 MED ORDER — ROCURONIUM BROMIDE 10 MG/ML (PF) SYRINGE
PREFILLED_SYRINGE | INTRAVENOUS | Status: DC | PRN
  Administered 2021-06-25: 70 mg via INTRAVENOUS

## 2021-06-25 MED ORDER — DEXAMETHASONE SODIUM PHOSPHATE 10 MG/ML IJ SOLN
INTRAMUSCULAR | Status: AC
  Filled 2021-06-25: qty 1

## 2021-06-25 MED ORDER — LIDOCAINE HCL 1 % IJ SOLN
INTRAMUSCULAR | Status: DC | PRN
Start: 2021-06-25 — End: 2021-06-25
  Administered 2021-06-25: 10 mL

## 2021-06-25 MED ORDER — IBUPROFEN 600 MG PO TABS: 600.0000 mg | ORAL_TABLET | Freq: Three times a day (TID) | ORAL | 1 refills | Status: DC | PRN

## 2021-06-25 MED ORDER — OXYCODONE HCL 5 MG PO TABS: 5.0000 mg | ORAL_TABLET | Freq: Once | ORAL | Status: DC | PRN

## 2021-06-25 MED ORDER — CEFAZOLIN SODIUM-DEXTROSE 2-4 GM/100ML-% IV SOLN
2.0000 g | INTRAVENOUS | Status: AC
  Administered 2021-06-25: 2 g via INTRAVENOUS

## 2021-06-25 MED ORDER — MIDAZOLAM HCL 2 MG/2ML IJ SOLN
INTRAMUSCULAR | Status: AC
  Filled 2021-06-25: qty 2

## 2021-06-25 MED ORDER — DEXMEDETOMIDINE (PRECEDEX) IN NS 20 MCG/5ML (4 MCG/ML) IV SYRINGE
PREFILLED_SYRINGE | INTRAVENOUS | Status: DC | PRN
  Administered 2021-06-25 (×2): 8 ug via INTRAVENOUS

## 2021-06-25 MED ORDER — ONDANSETRON HCL 4 MG/2ML IJ SOLN
INTRAMUSCULAR | Status: AC
  Filled 2021-06-25: qty 2

## 2021-06-25 MED ORDER — ACETAMINOPHEN 500 MG PO TABS
1000.0000 mg | ORAL_TABLET | Freq: Once | ORAL | Status: AC
  Administered 2021-06-25: 1000 mg via ORAL

## 2021-06-25 MED ORDER — BUPIVACAINE HCL (PF) 0.25 % IJ SOLN
INTRAMUSCULAR | Status: DC | PRN
  Administered 2021-06-25: 13 mL

## 2021-06-25 MED ORDER — DEXMEDETOMIDINE (PRECEDEX) IN NS 20 MCG/5ML (4 MCG/ML) IV SYRINGE
PREFILLED_SYRINGE | INTRAVENOUS | Status: AC
  Filled 2021-06-25: qty 10

## 2021-06-25 MED ORDER — LACTATED RINGERS IV SOLN: INTRAVENOUS | Status: DC

## 2021-06-25 MED ORDER — ACETAMINOPHEN 500 MG PO TABS
ORAL_TABLET | ORAL | Status: AC
  Filled 2021-06-25: qty 2

## 2021-06-25 MED ORDER — PROPOFOL 10 MG/ML IV BOLUS
INTRAVENOUS | Status: DC | PRN
Start: 2021-06-25 — End: 2021-06-25
  Administered 2021-06-25: 160 mg via INTRAVENOUS
  Administered 2021-06-25: 40 mg via INTRAVENOUS

## 2021-06-25 MED ORDER — OXYCODONE-ACETAMINOPHEN 5-325 MG PO TABS: 1.0000 | ORAL_TABLET | Freq: Four times a day (QID) | ORAL | 0 refills | Status: DC | PRN

## 2021-06-25 MED ORDER — ROCURONIUM BROMIDE 10 MG/ML (PF) SYRINGE
PREFILLED_SYRINGE | INTRAVENOUS | Status: AC
  Filled 2021-06-25: qty 10

## 2021-06-25 MED ORDER — SODIUM CHLORIDE 0.9 % IR SOLN
Status: DC | PRN
  Administered 2021-06-25: 3000 mL

## 2021-06-25 MED ORDER — SUGAMMADEX SODIUM 200 MG/2ML IV SOLN
INTRAVENOUS | Status: DC | PRN
  Administered 2021-06-25: 200 mg via INTRAVENOUS

## 2021-06-25 MED ORDER — ONDANSETRON HCL 4 MG/2ML IJ SOLN: 4.0000 mg | Freq: Once | INTRAMUSCULAR | Status: DC | PRN

## 2021-06-25 MED ORDER — AMISULPRIDE (ANTIEMETIC) 5 MG/2ML IV SOLN: 10.0000 mg | Freq: Once | INTRAVENOUS | Status: DC | PRN

## 2021-06-25 MED ORDER — ONDANSETRON HCL 4 MG/2ML IJ SOLN
INTRAMUSCULAR | Status: DC | PRN
  Administered 2021-06-25: 4 mg via INTRAVENOUS

## 2021-06-25 MED ORDER — FENTANYL CITRATE (PF) 100 MCG/2ML IJ SOLN
INTRAMUSCULAR | Status: DC | PRN
  Administered 2021-06-25 (×4): 50 ug via INTRAVENOUS

## 2021-06-25 MED ORDER — OXYCODONE HCL 5 MG/5ML PO SOLN: 5.0000 mg | Freq: Once | ORAL | Status: DC | PRN

## 2021-06-25 MED ORDER — 0.9 % SODIUM CHLORIDE (POUR BTL) OPTIME
TOPICAL | Status: DC | PRN
  Administered 2021-06-25: 500 mL

## 2021-06-25 MED ORDER — CEFAZOLIN SODIUM-DEXTROSE 2-4 GM/100ML-% IV SOLN
INTRAVENOUS | Status: AC
  Filled 2021-06-25: qty 100

## 2021-06-25 MED ORDER — LIDOCAINE HCL (PF) 2 % IJ SOLN
INTRAMUSCULAR | Status: AC
  Filled 2021-06-25: qty 5

## 2021-06-25 MED ORDER — LIDOCAINE 2% (20 MG/ML) 5 ML SYRINGE
INTRAMUSCULAR | Status: DC | PRN
  Administered 2021-06-25: 40 mg via INTRAVENOUS
  Administered 2021-06-25: 60 mg via INTRAVENOUS

## 2021-06-25 MED ORDER — POVIDONE-IODINE 10 % EX SWAB: 2.0000 "application " | Freq: Once | CUTANEOUS | Status: DC

## 2021-06-25 MED ORDER — DEXAMETHASONE SODIUM PHOSPHATE 10 MG/ML IJ SOLN
INTRAMUSCULAR | Status: DC | PRN
  Administered 2021-06-25: 10 mg via INTRAVENOUS

## 2021-06-25 MED ORDER — KETOROLAC TROMETHAMINE 30 MG/ML IJ SOLN
INTRAMUSCULAR | Status: DC | PRN
Start: 2021-06-25 — End: 2021-06-25
  Administered 2021-06-25: 30 mg via INTRAVENOUS

## 2021-06-25 MED ORDER — MIDAZOLAM HCL 5 MG/5ML IJ SOLN
INTRAMUSCULAR | Status: DC | PRN
  Administered 2021-06-25: 2 mg via INTRAVENOUS

## 2021-06-25 MED ORDER — PROPOFOL 10 MG/ML IV BOLUS
INTRAVENOUS | Status: AC
  Filled 2021-06-25: qty 20

## 2021-06-25 SURGICAL SUPPLY — 55 items
ABLATOR SURESOUND NOVASURE (ABLATOR) ×3 IMPLANT
ADH SKN CLS APL DERMABOND .7 (GAUZE/BANDAGES/DRESSINGS) ×2
APL PRP STRL LF DISP 70% ISPRP (MISCELLANEOUS)
APL SRG 38 LTWT LNG FL B (MISCELLANEOUS)
APPLICATOR ARISTA FLEXITIP XL (MISCELLANEOUS) IMPLANT
BAG SPEC RTRVL LRG 6X4 10 (ENDOMECHANICALS)
CABLE HIGH FREQUENCY MONO STRZ (ELECTRODE) IMPLANT
CATH ROBINSON RED A/P 16FR (CATHETERS) ×2 IMPLANT
CHLORAPREP W/TINT 26 (MISCELLANEOUS) IMPLANT
COVER MAYO STAND STRL (DRAPES) ×3 IMPLANT
DERMABOND ADVANCED (GAUZE/BANDAGES/DRESSINGS) ×1
DERMABOND ADVANCED .7 DNX12 (GAUZE/BANDAGES/DRESSINGS) ×2 IMPLANT
DILATOR CANAL MILEX (MISCELLANEOUS) IMPLANT
DRSG OPSITE POSTOP 3X4 (GAUZE/BANDAGES/DRESSINGS) ×3 IMPLANT
DRSG TELFA 3X8 NADH (GAUZE/BANDAGES/DRESSINGS) ×3 IMPLANT
DURAPREP 26ML APPLICATOR (WOUND CARE) ×3 IMPLANT
GAUZE 4X4 16PLY ~~LOC~~+RFID DBL (SPONGE) ×3 IMPLANT
GLOVE SURG ENC MOIS LTX SZ7.5 (GLOVE) ×3 IMPLANT
GLOVE SURG UNDER POLY LF SZ7 (GLOVE) ×6 IMPLANT
GLOVE SURG UNDER POLY LF SZ7.5 (GLOVE) ×6 IMPLANT
GOWN STRL REUS W/TWL LRG LVL3 (GOWN DISPOSABLE) ×6 IMPLANT
HEMOSTAT ARISTA ABSORB 3G PWDR (HEMOSTASIS) IMPLANT
HIBICLENS CHG 4% 4OZ BTL (MISCELLANEOUS) ×3 IMPLANT
IV NS IRRIG 3000ML ARTHROMATIC (IV SOLUTION) ×1 IMPLANT
KIT PROCEDURE FLUENT (KITS) ×3 IMPLANT
KIT TURNOVER CYSTO (KITS) ×3 IMPLANT
LIGASURE LAP MARYLAND 5MM 37CM (ELECTROSURGICAL) ×1 IMPLANT
LIGASURE VESSEL 5MM BLUNT TIP (ELECTROSURGICAL) ×2 IMPLANT
NEEDLE INSUFFLATION 120MM (ENDOMECHANICALS) ×3 IMPLANT
NS IRRIG 1000ML POUR BTL (IV SOLUTION) IMPLANT
NS IRRIG 500ML POUR BTL (IV SOLUTION) ×1 IMPLANT
PACK LAPAROSCOPY BASIN (CUSTOM PROCEDURE TRAY) ×3 IMPLANT
PACK TRENDGUARD 450 HYBRID PRO (MISCELLANEOUS) ×2 IMPLANT
PACK VAGINAL MINOR WOMEN LF (CUSTOM PROCEDURE TRAY) ×3 IMPLANT
PAD DRESSING TELFA 3X8 NADH (GAUZE/BANDAGES/DRESSINGS) ×2 IMPLANT
PAD OB MATERNITY 4.3X12.25 (PERSONAL CARE ITEMS) ×3 IMPLANT
PAD PREP 24X48 CUFFED NSTRL (MISCELLANEOUS) ×3 IMPLANT
PANTS MESH DISP 2XL (UNDERPADS AND DIAPERS) ×2 IMPLANT
PANTS MESH DISPOSABLE 2XL (UNDERPADS AND DIAPERS)
POUCH SPECIMEN RETRIEVAL 10MM (ENDOMECHANICALS) IMPLANT
SEAL ROD LENS SCOPE MYOSURE (ABLATOR) ×3 IMPLANT
SET SUCTION IRRIG HYDROSURG (IRRIGATION / IRRIGATOR) IMPLANT
SET TUBE SMOKE EVAC HIGH FLOW (TUBING) ×3 IMPLANT
SUT MNCRL AB 3-0 PS2 27 (SUTURE) ×3 IMPLANT
SUT VIC AB 4-0 PS2 18 (SUTURE) IMPLANT
SUT VICRYL 0 ENDOLOOP (SUTURE) IMPLANT
SUT VICRYL 0 TIES 12 18 (SUTURE) IMPLANT
SUT VICRYL 0 UR6 27IN ABS (SUTURE) ×3 IMPLANT
SYR 50ML LL SCALE MARK (SYRINGE) ×3 IMPLANT
TOWEL OR 17X26 10 PK STRL BLUE (TOWEL DISPOSABLE) ×6 IMPLANT
TRAY FOLEY W/BAG SLVR 14FR LF (SET/KITS/TRAYS/PACK) ×3 IMPLANT
TRENDGUARD 450 HYBRID PRO PACK (MISCELLANEOUS) ×3
TROCAR BLADELESS OPT 5 100 (ENDOMECHANICALS) ×4 IMPLANT
TROCAR XCEL NON-BLD 11X100MML (ENDOMECHANICALS) ×3 IMPLANT
WARMER LAPAROSCOPE (MISCELLANEOUS) ×3 IMPLANT

## 2021-06-25 NOTE — Discharge Instructions (Addendum)
°  Post Anesthesia Home Care Instructions  Activity: Get plenty of rest for the remainder of the day. A responsible individual must stay with you for 24 hours following the procedure.  For the next 24 hours, DO NOT: -Drive a car -Advertising copywriter -Drink alcoholic beverages -Take any medication unless instructed by your physician -Make any legal decisions or sign important papers.  Meals: Start with liquid foods such as gelatin or soup. Progress to regular foods as tolerated. Avoid greasy, spicy, heavy foods. If nausea and/or vomiting occur, drink only clear liquids until the nausea and/or vomiting subsides. Call your physician if vomiting continues.  Special Instructions/Symptoms: Your throat may feel dry or sore from the anesthesia or the breathing tube placed in your throat during surgery. If this causes discomfort, gargle with warm salt water. The discomfort should disappear within 24 hours.  If you had a scopolamine patch placed behind your ear for the management of post- operative nausea and/or vomiting:  1. The medication in the patch is effective for 72 hours, after which it should be removed.  Wrap patch in a tissue and discard in the trash. Wash hands thoroughly with soap and water. 2. You may remove the patch earlier than 72 hours if you experience unpleasant side effects which may include dry mouth, dizziness or visual disturbances. 3. Avoid touching the patch. Wash your hands with soap and water after contact with the patch.      No Tylenol/acetaminophen until 6pm

## 2021-06-25 NOTE — Anesthesia Postprocedure Evaluation (Signed)
Anesthesia Post Note  Patient: Alison Hamilton  Procedure(s) Performed: LAPAROSCOPIC BILATERAL SALPINGECTOMY (Bilateral: Abdomen) DILATATION AND CURETTAGE /HYSTEROSCOPY WITH ENDOMETRIAL ABLATION WITH NOVASURE (Vagina )     Patient location during evaluation: PACU Anesthesia Type: General Level of consciousness: awake and alert Pain management: pain level controlled Vital Signs Assessment: post-procedure vital signs reviewed and stable Respiratory status: spontaneous breathing, nonlabored ventilation and respiratory function stable Cardiovascular status: blood pressure returned to baseline and stable Postop Assessment: no apparent nausea or vomiting Anesthetic complications: no   No notable events documented.  Last Vitals:  Vitals:   06/25/21 1530 06/25/21 1545  BP: (!) 153/100 (!) 149/84  Pulse: 82 79  Resp: 13 18  Temp:    SpO2: 100% 96%    Last Pain:  Vitals:   06/25/21 1526  TempSrc:   PainSc: 6                  Lucretia Kern

## 2021-06-25 NOTE — Transfer of Care (Signed)
Immediate Anesthesia Transfer of Care Note  Patient: Alison Hamilton  Procedure(s) Performed: LAPAROSCOPIC BILATERAL SALPINGECTOMY (Bilateral: Abdomen) DILATATION AND CURETTAGE /HYSTEROSCOPY WITH ENDOMETRIAL ABLATION WITH NOVASURE (Vagina )  Patient Location: PACU  Anesthesia Type:General  Level of Consciousness: awake, alert , oriented and patient cooperative  Airway & Oxygen Therapy: Patient Spontanous Breathing and Patient connected to face mask oxygen  Post-op Assessment: Report given to RN and Post -op Vital signs reviewed and stable  Post vital signs: Reviewed and stable  Last Vitals:  Vitals Value Taken Time  BP 152/100 06/25/21 1515  Temp 36.7 C 06/25/21 1511  Pulse 89 06/25/21 1516  Resp 19 06/25/21 1516  SpO2 97 % 06/25/21 1516  Vitals shown include unvalidated device data.  Last Pain:  Vitals:   06/25/21 1213  TempSrc: Oral  PainSc: 0-No pain      Patients Stated Pain Goal: 5 (06/25/21 1213)  Complications: No notable events documented.

## 2021-06-25 NOTE — Anesthesia Preprocedure Evaluation (Signed)
Anesthesia Evaluation  Patient identified by MRN, date of birth, ID band Patient awake    Reviewed: Allergy & Precautions, NPO status , Patient's Chart, lab work & pertinent test results  History of Anesthesia Complications Negative for: history of anesthetic complications  Airway Mallampati: II  TM Distance: >3 FB Neck ROM: Full    Dental  (+) Dental Advisory Given, Upper Dentures   Pulmonary Current Smoker,    Pulmonary exam normal        Cardiovascular negative cardio ROS Normal cardiovascular exam     Neuro/Psych Anxiety negative neurological ROS     GI/Hepatic negative GI ROS, (+)     substance abuse (recovered 2011)  alcohol use and cocaine use,   Endo/Other  negative endocrine ROS  Renal/GU negative Renal ROS  negative genitourinary   Musculoskeletal negative musculoskeletal ROS (+)   Abdominal   Peds  Hematology negative hematology ROS (+)   Anesthesia Other Findings   Reproductive/Obstetrics                             Anesthesia Physical Anesthesia Plan  ASA: 2  Anesthesia Plan: General   Post-op Pain Management: Tylenol PO (pre-op)* and Toradol IV (intra-op)*   Induction: Intravenous  PONV Risk Score and Plan: 3 and Ondansetron, Dexamethasone, Treatment may vary due to age or medical condition and Midazolam  Airway Management Planned: Oral ETT  Additional Equipment: None  Intra-op Plan:   Post-operative Plan: Extubation in OR  Informed Consent: I have reviewed the patients History and Physical, chart, labs and discussed the procedure including the risks, benefits and alternatives for the proposed anesthesia with the patient or authorized representative who has indicated his/her understanding and acceptance.     Dental advisory given  Plan Discussed with:   Anesthesia Plan Comments:         Anesthesia Quick Evaluation

## 2021-06-25 NOTE — Anesthesia Procedure Notes (Signed)
Procedure Name: Intubation Date/Time: 06/25/2021 1:44 PM Performed by: Rogers Blocker, CRNA Pre-anesthesia Checklist: Patient identified, Emergency Drugs available, Suction available and Patient being monitored Patient Re-evaluated:Patient Re-evaluated prior to induction Oxygen Delivery Method: Circle System Utilized Preoxygenation: Pre-oxygenation with 100% oxygen Induction Type: IV induction Ventilation: Mask ventilation without difficulty Laryngoscope Size: Mac and 3 Grade View: Grade I Tube type: Oral Number of attempts: 1 Airway Equipment and Method: Stylet and Bite block Placement Confirmation: ETT inserted through vocal cords under direct vision, positive ETCO2 and breath sounds checked- equal and bilateral Secured at: 20 cm Tube secured with: Tape Dental Injury: Teeth and Oropharynx as per pre-operative assessment

## 2021-06-25 NOTE — Op Note (Signed)
Preop Diagnosis: 1.Desires Surgical Sterilization 2.Menorrhagia  Postop Diagnosis: 1.Desires Surgical Sterilization 2.Menorrhagia  Procedure: 1.LAPAROSCOPIC BILATERAL SALPINGECTOMY 2.HYSTEROSCOPY 3.DILATION AND CURETTAGE 4.NOVASURE ABLATION  Anesthesia: General   Attending: Purcell Nails, MD   Assistant:  Gershon Cull, scrub tech (for laparoscopic portion of case)  Findings: Normal appearing bilateral ovaries and tubes Uterine Sound length 8cm Cervical Length 3.5cm Cavity Length 4.5cm Cavity Width 4.0cm Power 99 watts Time 58secs  Pathology: 1.Bilateral fallopian tubes 2.Endometrial Curettings  Fluids: 700 cc Hysteroscopic Fluid Deficit 50 cc  UOP: 25 cc  EBL: 10 cc  Complications: None  Procedure: The patient was taken to the operating room after the risks, benefits, alternatives, complications, treatment options, and expected outcomes were discussed with the patient. The patient verbalized understanding, the patient concurred with the proposed plan and consent signed and witnessed. The patient was taken to the Operating Room, identified as Alison Hamilton  and procedure verified as sterilization procedure via laparoscopic bilateral salpingectomy. A Time Out was held and the above information confirmed.  The patient was placed under general anesthesia per anesthesia staff, the patient was placed in modified dorsal lithotomy position and was prepped, draped, and catheterized in the normal, sterile fashion.  The cervix was visualized and an intrauterine manipulator was placed. A  10 mm umbilical incision was then performed. Veress needle was passed and pneumoperitoneum was established. A 10 mm trocar was advanced into the intraabdominal cavity, the laparoscope was introduced and findings as noted above.  A 62mm incision was made suprapubically and 21mm trocar advanced into the intraabdominal cavity under direct visualization.  The same was done in the RLQ.  The right fallopian  tube was identified and carried out to its fimbriated end and excised with the Ligasure.  The same was done on the contralateral side.  The laparoscope was removed and pneumoperitoneum released.  The fascia was repaired with an interrupted stitch of 0 vicryl and the skin was reapproximated with 3-0 monocryl via subcuticular stitch.  Dermabond was applied to close the 62mm incisions and used to reinforce the 44mm umbilical incision.  Attention was then turned to the perineum.  A bivalve speculum was placed in the patient's vagina and the anterior lip of the cervix was grasped with a single tooth tenaculum. A paracervical block was administered using a total of 10 cc of 1% lidocaine. The uterus sounded to 8 cm. The cervix was dilated for passage of the hysteroscope. The hysteroscope was introduced into the uterine cavity and findings as noted above. Sharp curettage was performed until a gritty texture was noted and curettings sent to pathology. The hysteroscope was reintroduced and no obvious remaining intracavitary lesions were noted. The Novasure instrument was introduced and ablation performed without difficulty. Hysteroscope reintroduced and good ablation results were noted. All instruments were removed.   Sponge, lap and needle count were correct.  Patient tolerated the procedure well and was returned to the recovery room in good condition.

## 2021-06-25 NOTE — H&P (Signed)
Alison Hamilton is an 41 y.o. female. Pt well know to me desiring sterilization and h/o menorrhagia presenting for lap bilateral salpingectomy, hysteroscopy D&C and ablation.  Pertinent Gynecological History: H/o regular cycles  Menstrual History:  Patient's last menstrual period was 05/06/2021 (approximate).    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 No pertinent family history.  Social History:  reports that she has been smoking cigarettes. She started smoking about 36 years ago. She has never used smokeless tobacco. She reports that she does not currently use alcohol. She reports that she does not currently use drugs after having used the following drugs: "Crack" cocaine.  Allergies: Not on File  Medications Prior to Admission  Medication Sig Dispense Refill Last Dose   sertraline (ZOLOFT) 25 MG tablet Take 25 mg by mouth daily.   06/25/2021   valACYclovir (VALTREX) 500 MG tablet Take 500 mg by mouth daily.   Past Week    Review of Systems Denies F/C/N/V/D  Blood pressure (!) 155/97, pulse 92, temperature 98.2 F (36.8 C), temperature source Oral, resp. rate 17, height 5\' 7"  (1.702 m), weight 72.1 kg, last menstrual period  05/06/2021, SpO2 99 %, not currently breastfeeding. Physical Exam Lungs CTA CV RRR Abdomen soft, NT Ext no calf tenderness  Results for orders placed or performed during the hospital encounter of 06/25/21 (from the past 24 hour(s))  Pregnancy, urine POC     Status: None   Collection Time: 06/25/21 11:42 AM  Result Value Ref Range   Preg Test, Ur NEGATIVE NEGATIVE  CBC     Status: None   Collection Time: 06/25/21 11:55 AM  Result Value Ref Range   WBC 9.2 4.0 - 10.5 K/uL   RBC 4.54 3.87 - 5.11 MIL/uL   Hemoglobin 13.7 12.0 - 15.0 g/dL   HCT 06/27/21 06/27/21 - 38.9 %   MCV 87.4 80.0 - 100.0 fL   MCH 30.2 26.0 - 34.0 pg   MCHC 34.5 30.0 - 36.0 g/dL   RDW 37.3 42.8 - 76.8 %   Platelets 314 150 - 400 K/uL   nRBC 0.0 0.0 - 0.2 %    No results found.  Assessment/Plan: P3 desiring sterilization and control of menorrhagia presenting for lap BS, hysteroscopy, D&C, ablation.  Risks benefits alternatives discussed with the patient including but not limited to bleeding infection injury risk of regret and risk of failure.  Questions answered and consent signed and witnessed.  11.5 06/25/2021, 1:26 PM

## 2021-06-26 ENCOUNTER — Encounter (HOSPITAL_BASED_OUTPATIENT_CLINIC_OR_DEPARTMENT_OTHER): Payer: Self-pay | Admitting: Obstetrics and Gynecology

## 2021-06-26 LAB — SURGICAL PATHOLOGY

## 2022-06-18 ENCOUNTER — Emergency Department (HOSPITAL_COMMUNITY): Payer: Medicaid Other

## 2022-06-18 ENCOUNTER — Emergency Department (HOSPITAL_COMMUNITY)
Admission: EM | Admit: 2022-06-18 | Discharge: 2022-06-19 | Disposition: A | Discharge status: Home / Self Care | Payer: Medicaid Other | Attending: Emergency Medicine | Admitting: Emergency Medicine

## 2022-06-18 ENCOUNTER — Other Ambulatory Visit: Payer: Self-pay

## 2022-06-18 DIAGNOSIS — R071 Chest pain on breathing: Secondary | ICD-10-CM | POA: Diagnosis not present

## 2022-06-18 DIAGNOSIS — R0781 Pleurodynia: Secondary | ICD-10-CM | POA: Diagnosis present

## 2022-06-18 DIAGNOSIS — F17200 Nicotine dependence, unspecified, uncomplicated: Secondary | ICD-10-CM | POA: Diagnosis not present

## 2022-06-18 LAB — BASIC METABOLIC PANEL
Anion gap: 8 (ref 5–15)
BUN: 7 mg/dL (ref 6–20)
CO2: 26 mmol/L (ref 22–32)
Calcium: 9.6 mg/dL (ref 8.9–10.3)
Chloride: 102 mmol/L (ref 98–111)
Creatinine, Ser: 0.9 mg/dL (ref 0.44–1.00)
GFR, Estimated: >60 mL/min (ref >60)
Glucose, Bld: 102 mg/dL — ABNORMAL HIGH (ref 70–99)
Potassium: 3.9 mmol/L (ref 3.5–5.1)
Sodium: 136 mmol/L (ref 135–145)

## 2022-06-18 LAB — CBC
HCT: 37.6 % (ref 36.0–46.0)
Hemoglobin: 13.3 g/dL (ref 12.0–15.0)
MCH: 32.8 pg (ref 26.0–34.0)
MCHC: 35.4 g/dL (ref 30.0–36.0)
MCV: 92.8 fL (ref 80.0–100.0)
Platelets: 255 10*3/uL (ref 150–400)
RBC: 4.05 MIL/uL (ref 3.87–5.11)
RDW: 12.1 % (ref 11.5–15.5)
WBC: 8.4 10*3/uL (ref 4.0–10.5)
nRBC: 0 % (ref 0.0–0.2)

## 2022-06-18 LAB — D-DIMER, QUANTITATIVE: D-Dimer, Quant: <0.27 ug/mL-FEU (ref 0.00–0.50)

## 2022-06-18 MED ORDER — HYDROCODONE-ACETAMINOPHEN 5-325 MG PO TABS
1.0000 | ORAL_TABLET | Freq: Once | ORAL | Status: AC
  Administered 2022-06-19: 1 via ORAL
  Filled 2022-06-18: qty 1

## 2022-06-18 MED ORDER — IBUPROFEN 400 MG PO TABS
400.0000 mg | ORAL_TABLET | Freq: Once | ORAL | Status: AC
  Administered 2022-06-19: 400 mg via ORAL
  Filled 2022-06-18: qty 1

## 2022-06-18 MED ORDER — DOXYCYCLINE HYCLATE 100 MG PO TABS
100.0000 mg | ORAL_TABLET | Freq: Once | ORAL | Status: AC
  Administered 2022-06-19: 100 mg via ORAL
  Filled 2022-06-18: qty 1

## 2022-06-18 NOTE — ED Triage Notes (Signed)
Patient reports left lateral abdominal pain worse with deep inspiration onset last week with emesis .

## 2022-06-18 NOTE — ED Provider Notes (Signed)
[facility] Provider Note   CSN: [id number] Arrival date & time: 06/18/22  1919     History {Add pertinent medical, surgical, social history, OB history to HPI:1} Chief Complaint  Patient presents with   Abdominal Pain    Alison Hamilton is a 42 y.o. female.  The history is provided by the patient.  Patient presents with pain to her left lower ribs.  No traumas or falls.  No recent heavy lifting or working out.  She reports this has been ongoing for at least 5 days.  She reports recent nausea and vomiting, but no abdominal pain.  No significant fevers or cough.  She does feel short of breath and pain is exacerbated by breathing.  No previous history of VTE.  She is a current smoker.  No previous history of CAD.  She went to see her PCP for this pain earlier in the day and was told to go to the ER due to an abnormal x-ray (initial report was abnormal EKG but pt confirms it is the xray)     Home Medications Prior to Admission medications   Medication Sig Start Date End Date Taking? Authorizing Provider  ibuprofen (ADVIL) 600 MG tablet Take 1 tablet (600 mg total) by mouth every 8 (eight) hours as needed. 06/25/21   Everett Graff, MD  oxyCODONE-acetaminophen (PERCOCET) 5-325 MG tablet Take 1 tablet by mouth every 6 (six) hours as needed for severe pain. 06/25/21   Everett Graff, MD  sertraline (ZOLOFT) 25 MG tablet Take 25 mg by mouth daily.    [provider]  valACYclovir (VALTREX) 500 MG tablet Take 500 mg by mouth daily.    [provider]      Allergies    Patient has no known allergies.    Review of Systems   Review of Systems  Constitutional:  Negative for fever.  Cardiovascular:  Positive for chest pain. Negative for leg swelling.    Physical Exam Updated Vital Signs BP (!) 126/98   Pulse (!) 103   Temp 98.2 F (36.8 C) (Oral)   Resp 16   SpO2 100%  Physical Exam CONSTITUTIONAL: Well  developed/well nourished, anxious HEAD: Normocephalic/atraumatic EYES: EOMI/PERRL ENMT: Mucous membranes moist NECK: supple no meningeal signs SPINE/BACK:entire spine nontender CV: S1/S2 noted, no murmurs/rubs/gallops noted LUNGS: Lungs are clear to auscultation bilaterally, no apparent distress Chest-left lower costal margin tenderness noted, no crepitus, no rash ABDOMEN: soft, nontender, no rebound or guarding, bowel sounds noted throughout abdomen, no LUQ tenderness GU:no cva tenderness NEURO: Pt is awake/alert/appropriate, moves all extremitiesx4.  No facial droop.   EXTREMITIES: pulses normal/equal, full ROM, no calf tenderness or edema SKIN: warm, color normal PSYCH: Anxious  ED Results / Procedures / Treatments   Labs (all labs ordered are listed, but only abnormal results are displayed) Labs Reviewed  BASIC METABOLIC PANEL - Abnormal; Notable for the following components:      Result Value   Glucose, Bld 102 (*)    All other components within normal limits  CBC  D-DIMER, QUANTITATIVE    EKG EKG Interpretation  Date/Time:  Friday June 18 2022 23:43:52 EST Ventricular Rate:  85 PR Interval:  226 QRS Duration: 68 QT Interval:  361 QTC Calculation: 430 R Axis:   87 Text Interpretation: Sinus rhythm Prolonged PR interval Anteroseptal infarct, age indeterminate Confirmed by Ripley Fraise [id number]) on 06/18/2022 11:53:32 PM  Radiology DG Chest 2 View  Result Date: 06/18/2022 CLINICAL DATA:  Chest pain and shortness of breath. EXAM: CHEST - 2 VIEW COMPARISON:  06/30/2017 FINDINGS: The cardiac silhouette, mediastinal and hilar contours are within normal limits and stable. The lungs are clear. No pleural effusions. No pulmonary lesions. The bony thorax is intact. IMPRESSION: No acute cardiopulmonary findings. Electronically Signed   By: Marijo Sanes M.D.   On: 06/18/2022 20:26    Procedures Procedures  {Document cardiac monitor, telemetry assessment procedure when  appropriate:1}  Medications Ordered in ED Medications  HYDROcodone-acetaminophen (NORCO/VICODIN) 5-325 MG per tablet 1 tablet (has no administration in time range)  ibuprofen (ADVIL) tablet 400 mg (has no administration in time range)  doxycycline (VIBRA-TABS) tablet 100 mg (has no administration in time range)    ED Course/ Medical Decision Making/ A&P   {   Click here for ABCD2, HEART and other calculatorsREFRESH Note before signing :1}                          Medical Decision Making Risk Prescription drug management.   This patient presents to the ED for concern of chest pain, this involves an extensive number of treatment options, and is a complaint that carries with it a high risk of complications and morbidity.  The differential diagnosis includes but is not limited to acute coronary syndrome, aortic dissection, pulmonary embolism, pericarditis, pneumothorax, pneumonia, myocarditis, pleurisy, esophageal rupture   Comorbidities that complicate the patient evaluation: Patient's presentation is complicated by their history of depression  Social Determinants of Health: Patient's  tobacco use   increases the complexity of managing their presentation  Additional history obtained: Records reviewed previous admission documents  Lab Tests: I Ordered, and personally interpreted labs.  The pertinent results include: Labs are unremarkable  Imaging Studies ordered: I ordered imaging studies including X-ray chest   I independently visualized and interpreted imaging which showed no acute findings (x-ray earlier at outside facility showed infiltrates per written report) I agree with the radiologist interpretation  Cardiac Monitoring: The patient was maintained on a cardiac monitor.  I personally viewed and interpreted the cardiac monitor which showed an underlying rhythm of:  sinus rhythm  Medicines ordered and prescription drug management: I ordered medication including Vicodin for  pain Reevaluation of the patient after these medicines showed that the patient    {resolved/improved/worsened:23923::"improved"}  Test Considered: Patient is low risk / negative by Wells criteria and negative D-dimer, therefore do not feel that VTE workup is indicated.  Critical Interventions:  ***  Consultations Obtained: I requested consultation with the {consultation:26851}, and discussed  findings as well as pertinent plan - they recommend: ***  Reevaluation: After the interventions noted above, I reevaluated the patient and found that they have :{resolved/improved/worsened:23923::"improved"}  Complexity of problems addressed: Patient's presentation is most consistent with  [id number]  Disposition: After consideration of the diagnostic results and the patient's response to treatment,  I feel that the patent would benefit from {disposition:26850}.     {Document critical care time when appropriate:1} {Document review of labs and clinical decision tools ie heart score, Chads2Vasc2 etc:1}  {Document your independent review of radiology images, and any outside records:1} {Document your discussion with family members, caretakers, and with consultants:1} {Document social determinants of health affecting pt's care:1} {Document your decision making why or why not admission, treatments were needed:1} Final Clinical Impression(s) / ED Diagnoses Final diagnoses:  None    Rx / DC Orders ED Discharge Orders     None

## 2022-06-18 NOTE — ED Provider Triage Note (Signed)
Emergency Medicine Provider Triage Evaluation Note  MARCENA MATCHETT , a 42 y.o. female  was evaluated in triage.  Pt complains of left sided rib pain x 1 week. Went to her doctor today and they said they saw some EKG changes. Gave her a shot of toradol but it didn't help. Pain made worse to the touch and deep breathing. No hx of blood clots, but thinks she has a family hx of such. Not on OCP's. No recent surgeries or periods of immobilizations. Does smoke about 1/2 pack per day.   Review of Systems  Positive: Left sided rib pain, SOB Negative: Fever, cough, central chest pain, back pain, abd pain, urinary sx, leg pain or swelling  Physical Exam  BP (!) 126/98   Pulse (!) 103   Temp 98.2 F (36.8 C) (Oral)   Resp 16   SpO2 100%  Gen:   Awake, no distress   Resp:  Normal effort  MSK:   Moves extremities without difficulty  Other:    Medical Decision Making  Medically screening exam initiated at 7:47 PM.  Appropriate orders placed.  LEKEYSHA BENGE was informed that the remainder of the evaluation will be completed by another provider, this initial triage assessment does not replace that evaluation, and the importance of remaining in the ED until their evaluation is complete.  Workup initiated. Cannot PERC patient out due to tachycardia. Overall moderate risk for PE so will include d-dimer   Ziara Thelander T, PA-C 06/18/22 1951

## 2022-06-19 ENCOUNTER — Encounter (HOSPITAL_COMMUNITY): Payer: Self-pay

## 2022-06-19 MED ORDER — DOXYCYCLINE HYCLATE 100 MG PO CAPS: 100.0000 mg | ORAL_CAPSULE | Freq: Two times a day (BID) | ORAL | 0 refills | Status: DC

## 2022-07-27 ENCOUNTER — Emergency Department (HOSPITAL_COMMUNITY)
Admission: EM | Admit: 2022-07-27 | Discharge: 2022-07-28 | Disposition: A | Discharge status: Home / Self Care | Payer: Medicaid Other | Attending: Emergency Medicine | Admitting: Emergency Medicine

## 2022-07-27 ENCOUNTER — Encounter (HOSPITAL_COMMUNITY): Payer: Self-pay | Admitting: *Deleted

## 2022-07-27 DIAGNOSIS — L500 Allergic urticaria: Secondary | ICD-10-CM | POA: Insufficient documentation

## 2022-07-27 DIAGNOSIS — F17200 Nicotine dependence, unspecified, uncomplicated: Secondary | ICD-10-CM | POA: Diagnosis not present

## 2022-07-27 DIAGNOSIS — T7840XA Allergy, unspecified, initial encounter: Secondary | ICD-10-CM | POA: Diagnosis present

## 2022-07-27 DIAGNOSIS — Z8616 Personal history of COVID-19: Secondary | ICD-10-CM | POA: Insufficient documentation

## 2022-07-27 MED ORDER — FAMOTIDINE IN NACL 20-0.9 MG/50ML-% IV SOLN
20.0000 mg | Freq: Once | INTRAVENOUS | Status: AC
  Administered 2022-07-27: 20 mg via INTRAVENOUS
  Filled 2022-07-27: qty 50

## 2022-07-27 MED ORDER — EPINEPHRINE 0.3 MG/0.3ML IJ SOAJ: 0.3000 mg | INTRAMUSCULAR | 0 refills | Status: AC | PRN

## 2022-07-27 MED ORDER — DEXAMETHASONE SODIUM PHOSPHATE 10 MG/ML IJ SOLN
10.0000 mg | Freq: Once | INTRAMUSCULAR | Status: DC
  Filled 2022-07-27: qty 1

## 2022-07-27 MED ORDER — METHYLPREDNISOLONE SODIUM SUCC 125 MG IJ SOLR
125.0000 mg | Freq: Once | INTRAMUSCULAR | Status: AC
  Administered 2022-07-27: 125 mg via INTRAVENOUS
  Filled 2022-07-27: qty 2

## 2022-07-27 MED ORDER — DIPHENHYDRAMINE HCL 50 MG/ML IJ SOLN
25.0000 mg | Freq: Once | INTRAMUSCULAR | Status: AC
  Administered 2022-07-27: 25 mg via INTRAVENOUS
  Filled 2022-07-27: qty 1

## 2022-07-27 NOTE — Discharge Instructions (Addendum)
Thank you for coming to [facility] Emergency Department. You were seen for hives, itching. We did an exam, labs, and imaging, and these showed a systemic allergic reaction. You can take benadryl at home every 6-8 hours as needed for itching/hives. We have provided you info to call an allergist to make an appointment for allergy testing.   Please follow up with your primary care provider within 1 week.   Do not hesitate to return to the ED or call 911 if you experience: -Worsening symptoms -Wheezing, shortness of breath -Lightheadedness, passing out -Fevers/chills -Anything else that concerns you

## 2022-07-27 NOTE — ED Provider Triage Note (Signed)
Emergency Medicine Provider Triage Evaluation Note  Alison Hamilton , a 42 y.o. female  was evaluated in triage.  Pt complains of allergic reaction.  Started with "itching" on Friday.  She noticed a rash all over her body later that evening that has progressively worsened.  Was short of breath and had trouble breathing today.  Started a new muscle relaxer on March 8.  Denies any other abnormal exposures.  Review of Systems  Positive: See above Negative: See above  Physical Exam  BP (!) 129/91 (BP Location: Right Arm)   Pulse 90   Temp 99 F (37.2 C) (Oral)   Resp (!) 24   SpO2 98%  Gen:   Awake, no distress   Resp:  Normal effort, no stridor MSK:   Moves extremities without difficulty  Other:  Diffuse urticarial rash noted  Medical Decision Making  Medically screening exam initiated at 11:00 PM.  Appropriate orders placed.  Alison Hamilton was informed that the remainder of the evaluation will be completed by another provider, this initial triage assessment does not replace that evaluation, and the importance of remaining in the ED until their evaluation is complete.  Work up started   Harriet Pho, [place] 07/27/22 2302

## 2022-07-27 NOTE — ED Provider Notes (Signed)
[place] EMERGENCY DEPARTMENT AT [facility] Provider Note   CSN: [id number] Arrival date & time: 07/27/22  2240     History {Add pertinent medical, surgical, social history, OB history to HPI:1} Chief Complaint  Patient presents with   Allergic Reaction    Alison Hamilton is a 42 y.o. female with substance abuse, tobacco use, lumbar radiculopathy, anemia who presents with allergic reaction.  Patient  Pt having itching on Saturday then started having sob yesterday. Pt covered in hives to extremities and abd. Started new muscle relaxer on 3/8, no other new creams, detergents, perfumes    Allergic Reaction      Home Medications Prior to Admission medications   Medication Sig Start Date End Date Taking? Authorizing Provider  doxycycline (VIBRAMYCIN) 100 MG capsule Take 1 capsule (100 mg total) by mouth 2 (two) times daily. One po bid x 7 days 06/19/22   Ripley Fraise, MD  ibuprofen (ADVIL) 600 MG tablet Take 1 tablet (600 mg total) by mouth every 8 (eight) hours as needed. 06/25/21   Everett Graff, MD  oxyCODONE-acetaminophen (PERCOCET) 5-325 MG tablet Take 1 tablet by mouth every 6 (six) hours as needed for severe pain. 06/25/21   Everett Graff, MD  sertraline (ZOLOFT) 25 MG tablet Take 25 mg by mouth daily.    [provider]  valACYclovir (VALTREX) 500 MG tablet Take 500 mg by mouth daily.    [provider]      Allergies    Patient has no known allergies.    Review of Systems   Review of Systems Review of systems {pos/neg:18640::"Negative","Positive"} for ***.  A 10 point review of systems was performed and is negative unless otherwise reported in HPI.  Physical Exam Updated Vital Signs BP (!) 129/91 (BP Location: Right Arm)   Pulse 90   Temp 99 F (37.2 C) (Oral)   Resp (!) 24   SpO2 98%  Physical Exam General: Normal appearing {Desc; female/female:11659}, lying in bed.  HEENT: PERRLA, Sclera anicteric, MMM, trachea midline.   Cardiology: RRR, no murmurs/rubs/gallops. BL radial and DP pulses equal bilaterally.  Resp: Normal respiratory rate and effort. CTAB, no wheezes, rhonchi, crackles.  Abd: Soft, non-tender, non-distended. No rebound tenderness or guarding.  GU: Deferred. MSK: No peripheral edema or signs of trauma. Extremities without deformity or TTP. No cyanosis or clubbing. Skin: warm, dry. No rashes or lesions. Back: No CVA tenderness Neuro: A&Ox4, CNs II-XII grossly intact. MAEs. Sensation grossly intact.  Psych: Normal mood and affect.   ED Results / Procedures / Treatments   Labs (all labs ordered are listed, but only abnormal results are displayed) Labs Reviewed - No data to display  EKG None  Radiology No results found.  Procedures Procedures  {Document cardiac monitor, telemetry assessment procedure when appropriate:1}  Medications Ordered in ED Medications - No data to display  ED Course/ Medical Decision Making/ A&P                          Medical Decision Making   This patient presents to the ED for concern of ***, this involves an extensive number of treatment options, and is a complaint that carries with it a high risk of complications and morbidity.  I considered the following differential and admission for this acute, potentially life threatening condition.   MDM:    ***     Labs: I Ordered, and personally interpreted labs.  The pertinent results include:  ***  Imaging Studies ordered: I ordered imaging studies including *** I independently visualized and interpreted imaging. I agree with the radiologist interpretation  Additional history obtained from ***.  External records from outside source obtained and reviewed including ***  Cardiac Monitoring: The patient was maintained on a cardiac monitor.  I personally viewed and interpreted the cardiac monitored which showed an underlying rhythm of: ***  Reevaluation: After the interventions noted above, I  reevaluated the patient and found that they have :{resolved/improved/worsened:23923::"improved"}  Social Determinants of Health: ***  Disposition:  ***  Co morbidities that complicate the patient evaluation  Past Medical History:  Diagnosis Date   Anemia    GAD (generalized anxiety disorder)    Genital herpes    History of abnormal cervical Pap smear 12/2019   History of COVID-19    per pt 01/ 2022  w/ mild symptoms that resolved ;  and 07/ 2022  during pregnancy stated moderate to severe symptoms recovered at home ,  resolved   History of drug abuse in remission ([facility])    06-22-2021  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   I have reviewed the patients home medicines and have made adjustments as needed  Problem List / ED Course: Problem List Items Addressed This Visit   None        {Document critical care time when appropriate:1} {Document review of labs and clinical decision tools ie heart score, Chads2Vasc2 etc:1}  {Document your independent review of radiology images, and any outside records:1} {Document your discussion with family members, caretakers, and with consultants:1} {Document social determinants of health affecting pt's care:1} {Document your decision making why or why not admission, treatments were needed:1}  This note was created using dictation software, which may contain spelling or grammatical errors.

## 2022-07-27 NOTE — ED Triage Notes (Signed)
Pt having itching on Saturday then started having sob yesterday. Pt covered in hives to extremities and abd. Started new muscle relaxer on 3/8, no other new creams, detergents, perfumes

## 2023-01-10 ENCOUNTER — Other Ambulatory Visit: Payer: Self-pay | Admitting: Obstetrics and Gynecology

## 2023-01-10 ENCOUNTER — Ambulatory Visit
Admission: RE | Admit: 2023-01-10 | Discharge: 2023-01-10 | Disposition: A | Discharge status: Home / Self Care | Payer: Self-pay | Source: Ambulatory Visit | Attending: Obstetrics and Gynecology | Admitting: Obstetrics and Gynecology

## 2023-01-10 DIAGNOSIS — E049 Nontoxic goiter, unspecified: Secondary | ICD-10-CM

## 2023-01-20 ENCOUNTER — Encounter: Payer: Self-pay | Admitting: Obstetrics and Gynecology

## 2023-03-11 ENCOUNTER — Other Ambulatory Visit: Payer: Self-pay | Admitting: Obstetrics and Gynecology

## 2023-03-15 LAB — SURGICAL PATHOLOGY

## 2023-07-14 ENCOUNTER — Other Ambulatory Visit: Payer: Self-pay | Admitting: Obstetrics and Gynecology

## 2023-08-22 ENCOUNTER — Encounter (HOSPITAL_COMMUNITY): Payer: Self-pay | Admitting: Obstetrics and Gynecology

## 2023-08-23 ENCOUNTER — Encounter (HOSPITAL_COMMUNITY)
Admission: RE | Admit: 2023-08-23 | Discharge: 2023-08-23 | Disposition: A | Discharge status: Home / Self Care | Source: Ambulatory Visit | Attending: Obstetrics and Gynecology | Admitting: Obstetrics and Gynecology

## 2023-08-23 ENCOUNTER — Encounter (HOSPITAL_COMMUNITY): Payer: Self-pay | Admitting: Obstetrics and Gynecology

## 2023-08-23 DIAGNOSIS — Z01812 Encounter for preprocedural laboratory examination: Secondary | ICD-10-CM | POA: Diagnosis present

## 2023-08-23 DIAGNOSIS — Z01818 Encounter for other preprocedural examination: Secondary | ICD-10-CM | POA: Diagnosis not present

## 2023-08-23 DIAGNOSIS — Z01810 Encounter for preprocedural cardiovascular examination: Secondary | ICD-10-CM | POA: Diagnosis present

## 2023-08-23 LAB — TYPE AND SCREEN
ABO/RH(D): A POS
Antibody Screen: NEGATIVE

## 2023-08-23 LAB — CBC
HCT: 39.5 % (ref 36.0–46.0)
Hemoglobin: 13.6 g/dL (ref 12.0–15.0)
MCH: 32.4 pg (ref 26.0–34.0)
MCHC: 34.4 g/dL (ref 30.0–36.0)
MCV: 94 fL (ref 80.0–100.0)
Platelets: 265 10*3/uL (ref 150–400)
RBC: 4.2 MIL/uL (ref 3.87–5.11)
RDW: 12.2 % (ref 11.5–15.5)
WBC: 8.6 10*3/uL (ref 4.0–10.5)
nRBC: 0 % (ref 0.0–0.2)

## 2023-08-23 LAB — BASIC METABOLIC PANEL WITH GFR
Anion gap: 7 (ref 5–15)
BUN: <5 mg/dL — ABNORMAL LOW (ref 6–20)
CO2: 27 mmol/L (ref 22–32)
Calcium: 9.1 mg/dL (ref 8.9–10.3)
Chloride: 105 mmol/L (ref 98–111)
Creatinine, Ser: 0.78 mg/dL (ref 0.44–1.00)
GFR, Estimated: >60 mL/min (ref >60)
Glucose, Bld: 95 mg/dL (ref 70–99)
Potassium: 3.5 mmol/L (ref 3.5–5.1)
Sodium: 139 mmol/L (ref 135–145)

## 2023-08-23 NOTE — Pre-Procedure Instructions (Signed)
 Surgical Instructions   Your procedure is scheduled on :  Thursday,  08-25-2023. Report to [facility] Main Entrance "A" at 11:00  A.M., then check in with the Admitting office. Any questions or running late day of surgery: call [phone]  Questions prior to your surgery date: call [phone], Monday-Friday, 8am-4pm. If you experience any cold or flu symptoms such as cough, fever, chills, shortness of breath, etc. between now and your scheduled surgery, please notify your surgeon office.    Remember:  Do not eat any food after midnight the night before your surgery.  You may have clear liquids from midnight night before surgery until 10:00 AM.   You may drink clear liquids until 10:00 AM the morning of your surgery.   Clear liquids allowed are:  Water Carbonated Beverages Clear Tea (may sweeten, no milk, honey, etc.) Black Coffee Only (may sweeten, NO MILK, CREAM OR POWDERED CREAMER of any kind) Sport drinks, like Gatorade NO clear liquids after 10:00 AM day of surgery.  This includes no water,  candy,  and mints.    Take these medicines the morning of surgery with A SIPS OF WATER : Amlodipine  (norvasc )   May take these medicines IF NEEDED: Tizanidine (zanaflex) Oxycodone -acetaminphen (percocet) Hydroxyzine (atarax)  One week prior to surgery, STOP taking any Aspirin  (unless otherwise instructed by your surgeon) Aleve, Naproxen, Ibuprofen , Motrin , Advil , Goody's, BC's, all herbal medications, fish oil, and non-prescription vitamins.                     Do NOT Smoke (Tobacco/Vaping) for 24 hours prior to your procedure.    You will be asked to remove any contacts, glasses, piercing's, hearing aid's, dentures/partials prior to surgery. Please bring cases for these items if needed.    Patients discharged the day of surgery will not be allowed to drive home, and someone needs to stay with them for 24 hours.  SURGICAL WAITING ROOM VISITATION Patients may have no more than  2 support people in the waiting area - these visitors may rotate.   Pre-op nurse will coordinate an appropriate time for 1 ADULT support person, who may not rotate, to accompany patient in pre-op.  Children under the age of 23 must have an adult with them who is not the patient and must remain in the main waiting area with an adult.  If the patient needs to stay at the hospital during part of their recovery, the visitor guidelines for inpatient rooms apply.  Please refer to the [facility] website for the visitor guidelines for any additional information.   If you received a COVID test during your pre-op visit  it is requested that you wear a mask when out in public, stay away from anyone that may not be feeling well and notify your surgeon if you develop symptoms. If you have been in contact with anyone that has tested positive in the last 10 days please notify you surgeon.      Pre-operative CHG Bathing Instructions   You can play a key role in reducing the risk of infection after surgery. Your skin needs to be as free of germs as possible. You can reduce the number of germs on your skin by washing with CHG (chlorhexidine  gluconate) soap before surgery. CHG is an antiseptic soap that kills germs and continues to kill germs even after washing.   DO NOT use if you have an allergy to chlorhexidine /CHG or antibacterial soaps. If your skin becomes reddened or irritated,  stop using the CHG and notify Pre-Op nurse day of surgery  Please get dial soap or other antibacterial soap and shower following the instructions below.             TAKE A SHOWER THE NIGHT BEFORE SURGERY AND THE DAY OF SURGERY    Please keep in mind the following:  DO NOT shave, including legs and underarms, 48 hours prior to surgery.   You may shave your face before/day of surgery.  Place clean sheets on your bed the night before surgery Use a clean washcloth (not used since being washed) for each shower. DO NOT sleep with  pet's night before surgery.  CHG Shower Instructions:  Wash your face and private area with normal soap. If you choose to wash your hair, wash first with your normal shampoo.  After you use shampoo/soap, rinse your hair and body thoroughly to remove shampoo/soap residue.  Turn the water OFF and apply half the bottle of CHG soap to a CLEAN washcloth.  Apply CHG soap ONLY FROM YOUR NECK DOWN TO YOUR TOES (washing for 3-5 minutes)  DO NOT use CHG soap on face, private areas, open wounds, or sores.  Pay special attention to the area where your surgery is being performed.  If you are having back surgery, having someone wash your back for you may be helpful. Wait 2 minutes after CHG soap is applied, then you may rinse off the CHG soap.  Pat dry with a clean towel  Put on clean pajamas    Additional instructions for the day of surgery: DO NOT APPLY any lotions, oils, deodorants (may use underarm deodorant) , cologne/  perfumes  or  makeup  Do not wear jewelry /  piercing's/  metal/  permanent jewelry must be removed prior to arrival day of surgery  (this includes plastic) Do not wear nail polish, gel polish, artificial nails, or any other type of covering on natural finger nails (toe nails are okay) Do not bring valuables to the hospital. [facility] is not responsible for valuables/personal belongings. Put on clean/comfortable clothes.  Please brush your teeth.  Ask your nurse before applying any prescription medications to the skin.

## 2023-08-23 NOTE — Progress Notes (Signed)
 Spoke w/ via phone for pre-op interview--- pt Lab needs dos----    urine preg     Lab results------ lab appt 08-23-2023 @ 1100  getting CBC/ BMP/ T&S/ EKG COVID test -----patient states asymptomatic no test needed Arrive at ------- 1100 on 08-25-2023 NPO after MN NO Solid Food.  Clear liquids from MN until--- 1000 Pre-Surgery Ensure or G2: n/a ERAS protocol:  yes  Med rec completed Medications to take morning of surgery -----norvasc  Diabetic medication ----- n/a  GLP1 agonist last dose: n/a GLP1 instructions:  Patient instructed no nail polish to be worn day of surgery Patient instructed to bring photo id and insurance card day of surgery Patient aware to have Driver (ride ) / caregiver    for 24 hours after surgery - fiance, darryl jarrell Patient Special Instructions -----  will pick up bag w/ soap and written instructions at lab appt Pre-Op special Instructions ----- pt stated she was told she is staying overnight  Patient verbalized understanding of instructions that were given at this phone interview. Patient denies chest pain, sob, fever, cough at the interview.

## 2023-08-25 ENCOUNTER — Ambulatory Visit (HOSPITAL_BASED_OUTPATIENT_CLINIC_OR_DEPARTMENT_OTHER): Payer: Self-pay | Admitting: Anesthesiology

## 2023-08-25 ENCOUNTER — Ambulatory Visit (HOSPITAL_COMMUNITY): Payer: Self-pay | Admitting: Anesthesiology

## 2023-08-25 ENCOUNTER — Other Ambulatory Visit: Payer: Self-pay

## 2023-08-25 ENCOUNTER — Encounter (HOSPITAL_COMMUNITY): Payer: Self-pay | Admitting: Obstetrics and Gynecology

## 2023-08-25 ENCOUNTER — Encounter (HOSPITAL_COMMUNITY)
Admission: RE | Disposition: A | Discharge status: Home / Self Care | Payer: Self-pay | Source: Home / Self Care | Attending: Obstetrics and Gynecology

## 2023-08-25 ENCOUNTER — Ambulatory Visit (HOSPITAL_COMMUNITY)
Admission: RE | Admit: 2023-08-25 | Discharge: 2023-08-26 | Disposition: A | Discharge status: Home / Self Care | Payer: Self-pay | Attending: Obstetrics and Gynecology | Admitting: Obstetrics and Gynecology

## 2023-08-25 DIAGNOSIS — N888 Other specified noninflammatory disorders of cervix uteri: Secondary | ICD-10-CM | POA: Insufficient documentation

## 2023-08-25 DIAGNOSIS — F17210 Nicotine dependence, cigarettes, uncomplicated: Secondary | ICD-10-CM | POA: Diagnosis not present

## 2023-08-25 DIAGNOSIS — Z01818 Encounter for other preprocedural examination: Secondary | ICD-10-CM

## 2023-08-25 DIAGNOSIS — I10 Essential (primary) hypertension: Secondary | ICD-10-CM | POA: Insufficient documentation

## 2023-08-25 DIAGNOSIS — N9985 Post endometrial ablation syndrome: Secondary | ICD-10-CM | POA: Diagnosis present

## 2023-08-25 DIAGNOSIS — G709 Myoneural disorder, unspecified: Secondary | ICD-10-CM | POA: Diagnosis not present

## 2023-08-25 DIAGNOSIS — Z90710 Acquired absence of both cervix and uterus: Secondary | ICD-10-CM | POA: Diagnosis present

## 2023-08-25 DIAGNOSIS — F419 Anxiety disorder, unspecified: Secondary | ICD-10-CM | POA: Insufficient documentation

## 2023-08-25 DIAGNOSIS — Y838 Other surgical procedures as the cause of abnormal reaction of the patient, or of later complication, without mention of misadventure at the time of the procedure: Secondary | ICD-10-CM | POA: Diagnosis not present

## 2023-08-25 HISTORY — DX: Other cervical disc degeneration, unspecified cervical region: M50.30

## 2023-08-25 HISTORY — DX: Nontoxic goiter, unspecified: E04.9

## 2023-08-25 HISTORY — DX: Essential (primary) hypertension: I10

## 2023-08-25 HISTORY — PX: VAGINAL HYSTERECTOMY: SHX2639

## 2023-08-25 HISTORY — DX: Other spondylosis with radiculopathy, lumbosacral region: M47.27

## 2023-08-25 HISTORY — DX: Other spondylosis with radiculopathy, cervical region: M47.22

## 2023-08-25 HISTORY — DX: Post endometrial ablation syndrome: N99.85

## 2023-08-25 LAB — POCT PREGNANCY, URINE: Preg Test, Ur: NEGATIVE

## 2023-08-25 SURGERY — HYSTERECTOMY, VAGINAL
Anesthesia: General | Site: Vagina

## 2023-08-25 MED ORDER — KETOROLAC TROMETHAMINE 30 MG/ML IJ SOLN
30.0000 mg | Freq: Four times a day (QID) | INTRAMUSCULAR | Status: AC
  Administered 2023-08-25 – 2023-08-26 (×4): 30 mg via INTRAVENOUS
  Filled 2023-08-25 (×4): qty 1

## 2023-08-25 MED ORDER — LIDOCAINE 2% (20 MG/ML) 5 ML SYRINGE
INTRAMUSCULAR | Status: DC | PRN
  Administered 2023-08-25: 80 mg via INTRAVENOUS

## 2023-08-25 MED ORDER — VASOPRESSIN 20 UNIT/ML IV SOLN
INTRAVENOUS | Status: DC | PRN
  Administered 2023-08-25: 7 mL via INTRAMUSCULAR

## 2023-08-25 MED ORDER — FENTANYL CITRATE (PF) 100 MCG/2ML IJ SOLN
25.0000 ug | INTRAMUSCULAR | Status: DC | PRN
  Administered 2023-08-25 (×3): 50 ug via INTRAVENOUS

## 2023-08-25 MED ORDER — DIPHENHYDRAMINE HCL 50 MG/ML IJ SOLN: 12.5000 mg | Freq: Four times a day (QID) | INTRAMUSCULAR | Status: DC | PRN

## 2023-08-25 MED ORDER — CEFAZOLIN SODIUM-DEXTROSE 2-3 GM-%(50ML) IV SOLR
INTRAVENOUS | Status: DC | PRN
  Administered 2023-08-25: 2 g via INTRAVENOUS

## 2023-08-25 MED ORDER — DEXMEDETOMIDINE HCL IN NACL 80 MCG/20ML IV SOLN
INTRAVENOUS | Status: AC
  Filled 2023-08-25: qty 20

## 2023-08-25 MED ORDER — ACETAMINOPHEN 500 MG PO TABS
1000.0000 mg | ORAL_TABLET | Freq: Four times a day (QID) | ORAL | Status: DC
  Administered 2023-08-25 – 2023-08-26 (×4): 1000 mg via ORAL
  Filled 2023-08-25 (×4): qty 2

## 2023-08-25 MED ORDER — ORAL CARE MOUTH RINSE: 15.0000 mL | Freq: Once | OROMUCOSAL | Status: AC

## 2023-08-25 MED ORDER — ONDANSETRON HCL 4 MG/2ML IJ SOLN: 4.0000 mg | Freq: Four times a day (QID) | INTRAMUSCULAR | Status: DC | PRN

## 2023-08-25 MED ORDER — LACTATED RINGERS IV SOLN: INTRAVENOUS | Status: DC

## 2023-08-25 MED ORDER — AMLODIPINE BESYLATE 5 MG PO TABS: 5.0000 mg | ORAL_TABLET | Freq: Every day | ORAL | Status: DC

## 2023-08-25 MED ORDER — FENTANYL CITRATE (PF) 100 MCG/2ML IJ SOLN
INTRAMUSCULAR | Status: AC
  Filled 2023-08-25: qty 2

## 2023-08-25 MED ORDER — HYDROMORPHONE HCL 1 MG/ML IJ SOLN
0.5000 mg | INTRAMUSCULAR | Status: AC | PRN
  Administered 2023-08-25 (×2): 0.5 mg via INTRAVENOUS

## 2023-08-25 MED ORDER — ONDANSETRON HCL 4 MG/2ML IJ SOLN
INTRAMUSCULAR | Status: AC
  Filled 2023-08-25: qty 2

## 2023-08-25 MED ORDER — POVIDONE-IODINE 10 % EX SWAB: 2.0000 | Freq: Once | CUTANEOUS | Status: DC

## 2023-08-25 MED ORDER — OXYCODONE HCL 5 MG PO TABS
5.0000 mg | ORAL_TABLET | ORAL | Status: DC | PRN
  Administered 2023-08-26 (×3): 10 mg via ORAL
  Filled 2023-08-25 (×3): qty 2

## 2023-08-25 MED ORDER — HYDROMORPHONE 1 MG/ML IV SOLN
INTRAVENOUS | Status: AC
  Administered 2023-08-25: 30 mg via INTRAVENOUS
  Filled 2023-08-25: qty 30

## 2023-08-25 MED ORDER — ACETAMINOPHEN 500 MG PO TABS
ORAL_TABLET | ORAL | Status: AC
Start: 2023-08-25 — End: 2023-08-26
  Filled 2023-08-25: qty 2

## 2023-08-25 MED ORDER — SODIUM CHLORIDE (PF) 0.9 % IJ SOLN
INTRAMUSCULAR | Status: AC
  Filled 2023-08-25: qty 50

## 2023-08-25 MED ORDER — FENTANYL CITRATE (PF) 250 MCG/5ML IJ SOLN
INTRAMUSCULAR | Status: AC
  Filled 2023-08-25: qty 5

## 2023-08-25 MED ORDER — HYDROMORPHONE HCL 1 MG/ML IJ SOLN
INTRAMUSCULAR | Status: DC | PRN
  Administered 2023-08-25 (×2): .5 mg via INTRAVENOUS

## 2023-08-25 MED ORDER — ONDANSETRON HCL 4 MG/2ML IJ SOLN
INTRAMUSCULAR | Status: DC | PRN
  Administered 2023-08-25: 4 mg via INTRAVENOUS

## 2023-08-25 MED ORDER — SUGAMMADEX SODIUM 200 MG/2ML IV SOLN
INTRAVENOUS | Status: DC | PRN
  Administered 2023-08-25: 200 mg via INTRAVENOUS

## 2023-08-25 MED ORDER — LIDOCAINE 2% (20 MG/ML) 5 ML SYRINGE
INTRAMUSCULAR | Status: AC
Start: 2023-08-25 — End: ?
  Filled 2023-08-25: qty 5

## 2023-08-25 MED ORDER — HYDROMORPHONE HCL 1 MG/ML IJ SOLN
INTRAMUSCULAR | Status: AC
  Filled 2023-08-25: qty 0.5

## 2023-08-25 MED ORDER — PROPOFOL 10 MG/ML IV BOLUS
INTRAVENOUS | Status: DC | PRN
  Administered 2023-08-25: 160 mg via INTRAVENOUS
  Administered 2023-08-25: 125 ug/kg/min via INTRAVENOUS

## 2023-08-25 MED ORDER — CHLORHEXIDINE GLUCONATE 0.12 % MT SOLN
15.0000 mL | Freq: Once | OROMUCOSAL | Status: AC
  Administered 2023-08-25: 15 mL via OROMUCOSAL

## 2023-08-25 MED ORDER — IRBESARTAN 150 MG PO TABS
150.0000 mg | ORAL_TABLET | Freq: Every day | ORAL | Status: DC
  Administered 2023-08-25: 150 mg via ORAL
  Filled 2023-08-25 (×2): qty 1

## 2023-08-25 MED ORDER — ROCURONIUM BROMIDE 10 MG/ML (PF) SYRINGE
PREFILLED_SYRINGE | INTRAVENOUS | Status: AC
  Filled 2023-08-25: qty 10

## 2023-08-25 MED ORDER — SODIUM CHLORIDE 0.9% FLUSH: 9.0000 mL | INTRAVENOUS | Status: DC | PRN

## 2023-08-25 MED ORDER — ONDANSETRON HCL 4 MG/2ML IJ SOLN: 4.0000 mg | Freq: Once | INTRAMUSCULAR | Status: DC | PRN

## 2023-08-25 MED ORDER — HYDROMORPHONE HCL 1 MG/ML IJ SOLN
INTRAMUSCULAR | Status: AC
  Filled 2023-08-25: qty 1

## 2023-08-25 MED ORDER — VASOPRESSIN 20 UNIT/ML IV SOLN
INTRAVENOUS | Status: AC
  Filled 2023-08-25: qty 1

## 2023-08-25 MED ORDER — ESTRADIOL 0.1 MG/GM VA CREA
TOPICAL_CREAM | VAGINAL | Status: AC
  Filled 2023-08-25: qty 42.5

## 2023-08-25 MED ORDER — AMISULPRIDE (ANTIEMETIC) 5 MG/2ML IV SOLN: 10.0000 mg | Freq: Once | INTRAVENOUS | Status: DC | PRN

## 2023-08-25 MED ORDER — DEXAMETHASONE SODIUM PHOSPHATE 10 MG/ML IJ SOLN
INTRAMUSCULAR | Status: AC
Start: 2023-08-25 — End: ?
  Filled 2023-08-25: qty 1

## 2023-08-25 MED ORDER — KETOROLAC TROMETHAMINE 30 MG/ML IJ SOLN
INTRAMUSCULAR | Status: AC
  Filled 2023-08-25: qty 1

## 2023-08-25 MED ORDER — MIDAZOLAM HCL 2 MG/2ML IJ SOLN
INTRAMUSCULAR | Status: AC
Start: 2023-08-25 — End: ?
  Filled 2023-08-25: qty 2

## 2023-08-25 MED ORDER — FENTANYL CITRATE (PF) 250 MCG/5ML IJ SOLN
INTRAMUSCULAR | Status: DC | PRN
  Administered 2023-08-25: 50 ug via INTRAVENOUS
  Administered 2023-08-25: 100 ug via INTRAVENOUS
  Administered 2023-08-25 (×2): 50 ug via INTRAVENOUS

## 2023-08-25 MED ORDER — 0.9 % SODIUM CHLORIDE (POUR BTL) OPTIME
TOPICAL | Status: DC | PRN
  Administered 2023-08-25: 1000 mL

## 2023-08-25 MED ORDER — DEXAMETHASONE SODIUM PHOSPHATE 10 MG/ML IJ SOLN
INTRAMUSCULAR | Status: DC | PRN
  Administered 2023-08-25: 10 mg via INTRAVENOUS

## 2023-08-25 MED ORDER — NALOXONE HCL 0.4 MG/ML IJ SOLN: 0.4000 mg | INTRAMUSCULAR | Status: DC | PRN

## 2023-08-25 MED ORDER — DEXMEDETOMIDINE HCL IN NACL 200 MCG/50ML IV SOLN
INTRAVENOUS | Status: DC | PRN
  Administered 2023-08-25: 10 ug via INTRAVENOUS
  Administered 2023-08-25: 6 ug via INTRAVENOUS

## 2023-08-25 MED ORDER — ROCURONIUM BROMIDE 10 MG/ML (PF) SYRINGE
PREFILLED_SYRINGE | INTRAVENOUS | Status: DC | PRN
  Administered 2023-08-25: 60 mg via INTRAVENOUS

## 2023-08-25 MED ORDER — DIPHENHYDRAMINE HCL 12.5 MG/5ML PO ELIX: 12.5000 mg | ORAL_SOLUTION | Freq: Four times a day (QID) | ORAL | Status: DC | PRN

## 2023-08-25 MED ORDER — IBUPROFEN 600 MG PO TABS: 600.0000 mg | ORAL_TABLET | Freq: Four times a day (QID) | ORAL | Status: DC

## 2023-08-25 MED ORDER — MIDAZOLAM HCL 2 MG/2ML IJ SOLN
INTRAMUSCULAR | Status: DC | PRN
  Administered 2023-08-25: 2 mg via INTRAVENOUS

## 2023-08-25 MED ORDER — KETOROLAC TROMETHAMINE 30 MG/ML IJ SOLN
30.0000 mg | Freq: Once | INTRAMUSCULAR | Status: AC
  Administered 2023-08-25: 30 mg via INTRAVENOUS

## 2023-08-25 MED ORDER — ACETAMINOPHEN 500 MG PO TABS
1000.0000 mg | ORAL_TABLET | Freq: Once | ORAL | Status: AC
Start: 2023-08-25 — End: 2023-08-25
  Administered 2023-08-25: 1000 mg via ORAL

## 2023-08-25 MED ORDER — PROPOFOL 10 MG/ML IV BOLUS
INTRAVENOUS | Status: AC
  Filled 2023-08-25: qty 20

## 2023-08-25 MED ORDER — SODIUM CHLORIDE 0.9 % IV SOLN: INTRAVENOUS | Status: DC | PRN

## 2023-08-25 MED ORDER — CHLORHEXIDINE GLUCONATE 0.12 % MT SOLN
OROMUCOSAL | Status: AC
Start: 2023-08-25 — End: 2023-08-26
  Filled 2023-08-25: qty 15

## 2023-08-25 SURGICAL SUPPLY — 29 items
BAG COUNTER SPONGE SURGICOUNT (BAG) ×1 IMPLANT
CANISTER SUCT 3000ML PPV (MISCELLANEOUS) ×1 IMPLANT
DRAPE SHEET LG 3/4 BI-LAMINATE (DRAPES) ×1 IMPLANT
DRAPE STERI URO 9X17 APER PCH (DRAPES) ×1 IMPLANT
GLOVE BIO SURGEON STRL SZ7.5 (GLOVE) ×1 IMPLANT
GLOVE BIOGEL PI IND STRL 6.5 (GLOVE) ×1 IMPLANT
GLOVE BIOGEL PI IND STRL 7.5 (GLOVE) ×1 IMPLANT
GLOVE SURG UNDER POLY LF SZ7 (GLOVE) ×1 IMPLANT
GOWN STRL REUS W/ TWL LRG LVL3 (GOWN DISPOSABLE) ×4 IMPLANT
KIT TURNOVER KIT B (KITS) ×1 IMPLANT
NDL HYPO 22X1.5 SAFETY MO (MISCELLANEOUS) ×1 IMPLANT
NDL MAYO CATGUT SZ4 TPR NDL (NEEDLE) IMPLANT
NEEDLE HYPO 22X1.5 SAFETY MO (MISCELLANEOUS) ×1 IMPLANT
NEEDLE MAYO CATGUT SZ4 (NEEDLE) IMPLANT
NS IRRIG 1000ML POUR BTL (IV SOLUTION) ×1 IMPLANT
PACK VAGINAL WOMENS (CUSTOM PROCEDURE TRAY) ×1 IMPLANT
PAD OB MATERNITY 11 LF (PERSONAL CARE ITEMS) ×1 IMPLANT
SET CYSTO W/LG BORE CLAMP LF (SET/KITS/TRAYS/PACK) ×1 IMPLANT
SPECIMEN JAR MEDIUM (MISCELLANEOUS) IMPLANT
SPIKE FLUID TRANSFER (MISCELLANEOUS) ×1 IMPLANT
SUT VIC AB 0 CT1 18XCR BRD8 (SUTURE) ×3 IMPLANT
SUT VIC AB 0 CT1 27XBRD ANBCTR (SUTURE) IMPLANT
SUT VIC AB 2-0 SH 27XBRD (SUTURE) IMPLANT
SUT VICRYL 0 TIES 12 18 (SUTURE) ×1 IMPLANT
SYR BULB IRRIG 60ML STRL (SYRINGE) ×1 IMPLANT
SYR TB 1ML LUER SLIP (SYRINGE) ×1 IMPLANT
TOWEL GREEN STERILE FF (TOWEL DISPOSABLE) ×2 IMPLANT
TRAY FOLEY W/BAG SLVR 14FR (SET/KITS/TRAYS/PACK) ×1 IMPLANT
UNDERPAD 30X36 HEAVY ABSORB (UNDERPADS AND DIAPERS) ×1 IMPLANT

## 2023-08-25 NOTE — Anesthesia Preprocedure Evaluation (Addendum)
 Anesthesia Evaluation  Patient identified by MRN, date of birth, ID band Patient awake    Reviewed: Allergy & Precautions, NPO status , Patient's Chart, lab work & pertinent test results  Airway Mallampati: II  TM Distance: >3 FB Neck ROM: Full    Dental  (+) Dental Advisory Given, Partial Upper   Pulmonary Current Smoker and Patient abstained from smoking.   Pulmonary exam normal breath sounds clear to auscultation       Cardiovascular hypertension, Pt. on medications Normal cardiovascular exam Rhythm:Regular Rate:Normal     Neuro/Psych  PSYCHIATRIC DISORDERS Anxiety      Neuromuscular disease    GI/Hepatic negative GI ROS,,,(+)     substance abuse  alcohol use and cocaine use  Endo/Other  negative endocrine ROS    Renal/GU negative Renal ROS     Musculoskeletal negative musculoskeletal ROS (+)    Abdominal   Peds  Hematology negative hematology ROS (+)   Anesthesia Other Findings Day of surgery medications reviewed with the patient.  Reproductive/Obstetrics Post endometrial ablation syndrome                             Anesthesia Physical Anesthesia Plan  ASA: 2  Anesthesia Plan: General   Post-op Pain Management: Tylenol  PO (pre-op)* and Toradol  IV (intra-op)*   Induction: Intravenous  PONV Risk Score and Plan: 3 and Midazolam , Dexamethasone , Ondansetron  and Scopolamine patch - Pre-op  Airway Management Planned: Oral ETT  Additional Equipment:   Intra-op Plan:   Post-operative Plan: Extubation in OR  Informed Consent: I have reviewed the patients History and Physical, chart, labs and discussed the procedure including the risks, benefits and alternatives for the proposed anesthesia with the patient or authorized representative who has indicated his/her understanding and acceptance.     Dental advisory given  Plan Discussed with: CRNA  Anesthesia Plan Comments:         Anesthesia Quick Evaluation

## 2023-08-25 NOTE — Anesthesia Procedure Notes (Signed)
 Procedure Name: Intubation Date/Time: 08/25/2023 1:33 PM  Performed by: Ballard Levels, CRNAPre-anesthesia Checklist: Patient identified, Emergency Drugs available, Suction available and Patient being monitored Patient Re-evaluated:Patient Re-evaluated prior to induction Oxygen Delivery Method: Circle System Utilized Preoxygenation: Pre-oxygenation with 100% oxygen Induction Type: IV induction Ventilation: Mask ventilation without difficulty Laryngoscope Size: Mac and 3 Grade View: Grade I Tube type: Oral Number of attempts: 1 Airway Equipment and Method: Stylet and Oral airway Placement Confirmation: ETT inserted through vocal cords under direct vision, positive ETCO2 and breath sounds checked- equal and bilateral Secured at: 22 cm Tube secured with: Tape Dental Injury: Teeth and Oropharynx as per pre-operative assessment

## 2023-08-25 NOTE — Op Note (Signed)
 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  Pathology: Uterus and cervix  Fluids: 950 cc  UOP: 100 cc  EBL: 150 cc  Complications: None  Procedure: The patient was taken to the operating room after the risks, benefits and alternatives were discussed with the patient. The patient verbalized understanding and consent signed and witnessed. The patient was placed under general anesthesia per the anesthesiologist and a timeout was performed per protocol. The patient was prepped and draped in the normal sterile fashion in the dorsal lithotomy position.  A weighted speculum was placed the patient's vagina and the anterior lip of the cervix was grasped with a single-tooth tenaculum and Dever retractors were placed for vaginal wall retraction. The cervix was circumscribed with the bovie after injecting the cervix with pitressin at a concentration of 20 units of Pitressin in 100 cc of normal saline.  Once the cervix was circumscribed the anterior cul-de-sac was entered without difficulty.  The posterior cul-de-sac was entered without difficulty as well.  Curved Heaney clamps were used to clamp the uterosacral and cardinal ligaments and the tissue was then cut and suture ligated using 0 Vicryl. This was done bilaterally and sequentially. The remaining parametrial tissue was clamped, cut and suture ligated using 0 Vicryl in a sequential and bilateral fashion as well up to the utero-ovarian ligaments. The uterus and cervix were then handed off to be sent to pathology. The bilateral ovaries appeared to be within normal limits. A McCall  culdoplasty stitch was placed.  The angles of the cuff were sutured using 0 vicryl.  The cuff was then repaired to the midline with figure-of-eight and interrupted stitches of 0 Vicryl.  The McCall stitch was tied.  The cuff was noted to be hemostatic.    The patient tolerated the procedure well and was returned to the recovery room in good condition.   I was present and scrubbed and the assistant was required due to complexity of anatomy.

## 2023-08-25 NOTE — Anesthesia Postprocedure Evaluation (Signed)
 Anesthesia Post Note  Patient: Alison Hamilton  Procedure(s) Performed: HYSTERECTOMY, VAGINAL (Vagina )     Patient location during evaluation: PACU Anesthesia Type: General Level of consciousness: awake and alert Pain management: pain level controlled Vital Signs Assessment: post-procedure vital signs reviewed and stable Respiratory status: spontaneous breathing, nonlabored ventilation and respiratory function stable Cardiovascular status: blood pressure returned to baseline and stable Postop Assessment: no apparent nausea or vomiting Anesthetic complications: no   No notable events documented.  Last Vitals:  Vitals:   08/25/23 1515 08/25/23 1530  BP: (!) 164/99 (!) 144/84  Pulse: 86 84  Resp: 13 17  Temp:    SpO2: 100% 98%    Last Pain:  Vitals:   08/25/23 1549  TempSrc:   PainSc: 8                  Erin Havers

## 2023-08-25 NOTE — Transfer of Care (Signed)
 Immediate Anesthesia Transfer of Care Note  Patient: Alison Hamilton  Procedure(s) Performed: HYSTERECTOMY, VAGINAL (Vagina )  Patient Location: PACU  Anesthesia Type:General  Level of Consciousness: awake, oriented, and patient cooperative  Airway & Oxygen Therapy: Patient Spontanous Breathing and Patient connected to face mask oxygen  Post-op Assessment: Report given to RN and Post -op Vital signs reviewed and stable  Post vital signs: Reviewed and stable  Last Vitals:  Vitals Value Taken Time  BP 144/97 08/25/23 1500  Temp    Pulse 85 08/25/23 1505  Resp 13 08/25/23 1505  SpO2 100 % 08/25/23 1505  Vitals shown include unfiled device data.  Last Pain:  Vitals:   08/25/23 1213  TempSrc: Oral  PainSc: 0-No pain      Patients Stated Pain Goal: 5 (08/25/23 1213)  Complications: No notable events documented.

## 2023-08-25 NOTE — H&P (Signed)
 Alison Hamilton is an 43 y.o. female. Pt well known to me and presenting for scheduled hysterectomy.  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   History of Helicobacter pylori infection 12/2019   treated   Hypertension    Lumbosacral spondylosis with radiculopathy    11/ 2024 s/p  epidural steriod injection   Post endometrial ablation syndrome    Thyroid  goiter    nontoxic  (ultrasound in epic 01-10-2023 no nodule)   Wears contact lenses    Wears dentures    upper    Past Surgical History:  Procedure Laterality Date   COLONOSCOPY  08/28/2019   @[facility]    FOOT SURGERY Bilateral 2018   per pt broken bone in both feet,  only left foot w/ hardware   HYSTEROSCOPY WITH D & C N/A 06/25/2021   Procedure: DILATATION AND CURETTAGE /HYSTEROSCOPY WITH ENDOMETRIAL ABLATION WITH NOVASURE;  Surgeon: Renea Carrion, MD;  Location: [facility] [place];  Service: Gynecology;  Laterality: N/A;   LAPAROSCOPIC BILATERAL SALPINGECTOMY Bilateral 06/25/2021   Procedure: LAPAROSCOPIC BILATERAL SALPINGECTOMY;  Surgeon: Renea Carrion, MD;  Location: [facility];  Service: Gynecology;   Laterality: Bilateral;   WISDOM TOOTH EXTRACTION      History reviewed. No pertinent family history.  Social History:  reports that she has been smoking cigarettes. She started smoking about 38 years ago. She has never used smokeless tobacco. She reports that she does not currently use alcohol. She reports that she does not currently use drugs after having used the following drugs: "Crack" cocaine.  Allergies: No Known Allergies  Medications Prior to Admission  Medication Sig Dispense Refill Last Dose/Taking   acetaminophen  (TYLENOL ) 500 MG tablet Take 500 mg by mouth every 6 (six) hours as needed.   Past Week   amLODipine  (NORVASC ) 5 MG tablet Take 5 mg by mouth daily.   08/25/2023 at  9:00 AM   diclofenac Sodium (VOLTAREN) 1 % GEL Apply 4 g topically 4 (four) times daily as needed.   08/24/2023   ergocalciferol (VITAMIN D2) 1.25 MG (50000 UT) capsule Take 50,000 Units by mouth once a week. Sunday's   08/21/2023   hydrOXYzine (ATARAX) 25 MG tablet Take 25 mg by mouth daily as needed for anxiety.   08/23/2023   ibuprofen  (ADVIL ) 200 MG tablet Take 200-800 mg by mouth every 6 (six) hours as needed.   Unknown   Multiple Vitamin (MULTIVITAMIN ADULT PO) Take 1 capsule by mouth daily.   08/24/2023   naloxone  (NARCAN ) nasal spray 4 mg/0.1 mL Place 1 spray into the nose once.   Unknown   olmesartan (BENICAR)  20 MG tablet Take 20 mg by mouth daily.   08/24/2023   oxyCODONE -acetaminophen  (PERCOCET) 7.5-325 MG tablet Take 1 tablet by mouth 3 (three) times daily as needed for severe pain (pain score 7-10).   08/25/2023 at  9:00 AM   tiZANidine (ZANAFLEX) 4 MG capsule Take 4 mg by mouth 3 (three) times daily as needed for muscle spasms.   08/23/2023   valACYclovir (VALTREX) 500 MG tablet Take 500 mg by mouth 2 (two) times daily as needed.   Unknown   EPINEPHrine  0.3 mg/0.3 mL IJ SOAJ injection Inject 0.3 mg into the muscle as needed for anaphylaxis. 1 each 0 More than a month    Review of Systems Denies  HA/visual changes/F/C/N/V/D  Blood pressure 111/75, pulse 82, temperature 98.2 F (36.8 C), temperature source Oral, resp. rate 17, height 5\' 7"  (1.702 m), weight 68.9 kg, last menstrual period 08/22/2023, SpO2 98%. Physical Exam Lungs unlabored CV RRR Abdomen soft, NT Extremities no calf tenderness  Results for orders placed or performed during the hospital encounter of 08/25/23 (from the past 24 hours)  Pregnancy, urine POC     Status: None   Collection Time: 08/25/23 11:57 AM  Result Value Ref Range   Preg Test, Ur NEGATIVE NEGATIVE    No results found.  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  Risks benefits and alternatives reviewed including but not limited to bleeding infection and injury.  Questions answered and consent signed and witnessed.  Alison Hamilton 08/25/2023, 12:54 PM

## 2023-08-26 ENCOUNTER — Encounter (HOSPITAL_COMMUNITY): Payer: Self-pay | Admitting: Obstetrics and Gynecology

## 2023-08-26 DIAGNOSIS — N9985 Post endometrial ablation syndrome: Secondary | ICD-10-CM | POA: Diagnosis not present

## 2023-08-26 LAB — BASIC METABOLIC PANEL WITH GFR
Anion gap: 8 (ref 5–15)
BUN: 6 mg/dL (ref 6–20)
CO2: 24 mmol/L (ref 22–32)
Calcium: 8.6 mg/dL — ABNORMAL LOW (ref 8.9–10.3)
Chloride: 102 mmol/L (ref 98–111)
Creatinine, Ser: 0.65 mg/dL (ref 0.44–1.00)
GFR, Estimated: >60 mL/min (ref >60)
Glucose, Bld: 126 mg/dL — ABNORMAL HIGH (ref 70–99)
Potassium: 3.8 mmol/L (ref 3.5–5.1)
Sodium: 134 mmol/L — ABNORMAL LOW (ref 135–145)

## 2023-08-26 LAB — CBC
HCT: 32.5 % — ABNORMAL LOW (ref 36.0–46.0)
Hemoglobin: 11.1 g/dL — ABNORMAL LOW (ref 12.0–15.0)
MCH: 31.3 pg (ref 26.0–34.0)
MCHC: 34.2 g/dL (ref 30.0–36.0)
MCV: 91.5 fL (ref 80.0–100.0)
Platelets: 219 10*3/uL (ref 150–400)
RBC: 3.55 MIL/uL — ABNORMAL LOW (ref 3.87–5.11)
RDW: 11.9 % (ref 11.5–15.5)
WBC: 12.3 10*3/uL — ABNORMAL HIGH (ref 4.0–10.5)
nRBC: 0 % (ref 0.0–0.2)

## 2023-08-26 MED ORDER — ACETAMINOPHEN 500 MG PO TABS: 1000.0000 mg | ORAL_TABLET | Freq: Four times a day (QID) | ORAL | 1 refills | Status: AC | PRN

## 2023-08-26 MED ORDER — IBUPROFEN 600 MG PO TABS: 600.0000 mg | ORAL_TABLET | Freq: Four times a day (QID) | ORAL | 1 refills | Status: AC | PRN

## 2023-08-26 MED ORDER — OXYCODONE HCL 5 MG PO TABS: 5.0000 mg | ORAL_TABLET | Freq: Four times a day (QID) | ORAL | 0 refills | Status: AC | PRN

## 2023-08-26 NOTE — Progress Notes (Signed)
 1 Day Post-Op Procedure(s) (LRB): HYSTERECTOMY, VAGINAL (N/A)  Subjective: Patient reports 4/10 pain.    Objective: I have reviewed patient's vital signs and intake and output.  General: alert, cooperative, and no distress Resp: clear to auscultation bilaterally Cardio: regular rate and rhythm GI: soft, NABS Extremities: no calf tenderness, SCDs are on Vaginal Bleeding: none  Assessment: s/p Procedure(s): HYSTERECTOMY, VAGINAL (N/A): stable  Plan: Advance diet Encourage IS Parameters for BP medication Labs in the morning Anticipate discharge tomorrow Routine post op care   LOS: 0 days    Madelene Schanz, MD 08/26/2023, 3:51 PM

## 2023-08-26 NOTE — Discharge Summary (Signed)
 Physician Discharge Summary  Patient ID: Alison Hamilton MRN: [id number] DOB/AGE: Sep 20, 1980 43 y.o.  Admit date: 08/25/2023 Discharge date: 08/26/2023  Admission Diagnoses:  Discharge Diagnoses:  Principal Problem:   Post endometrial ablation syndrome Active Problems:   S/P vaginal hysterectomy   Discharged Condition: good  Hospital Course: uncomplicated.  Underwent TVH and was discharged on pod 1 doing well having resumed all normal activities.  Consults: None  Significant Diagnostic Studies: labs: hgb 11.3 post op  Treatments: surgery: TVH  Discharge Exam: Blood pressure 124/67, pulse 89, temperature 98.6 F (37 C), temperature source Axillary, resp. rate 19, height 5\' 7"  (1.702 m), weight 68.9 kg, last menstrual period 08/22/2023, SpO2 99%. General appearance: alert, cooperative, and no distress Resp: unlabored Cardio: regular rate and rhythm GI: soft, minimal tenderness, ND, NABS Extremities: no calf tenderness, SCDs are on none  Disposition: Discharge disposition: 01-Home or Self Care        Allergies as of 08/26/2023   No Known Allergies      Medication List     STOP taking these medications    oxyCODONE -acetaminophen  7.5-325 MG tablet Commonly known as: PERCOCET       TAKE these medications    acetaminophen  500 MG tablet Commonly known as: TYLENOL  Take 2 tablets (1,000 mg total) by mouth every 6 (six) hours as needed. What changed: how much to take   amLODipine  5 MG tablet Commonly known as: NORVASC  Take 5 mg by mouth daily.   EPINEPHrine  0.3 mg/0.3 mL Soaj injection Commonly known as: EPI-PEN Inject 0.3 mg into the muscle as needed for anaphylaxis.   ergocalciferol 1.25 MG (50000 UT) capsule Commonly known as: VITAMIN D2 Take 50,000 Units by mouth once a week. Sunday's   hydrOXYzine 25 MG tablet Commonly known as: ATARAX Take 25 mg by mouth daily as needed for anxiety.   ibuprofen  600 MG tablet Commonly known as: ADVIL  Take  1 tablet (600 mg total) by mouth every 6 (six) hours as needed. What changed:  medication strength how much to take   MULTIVITAMIN ADULT PO Take 1 capsule by mouth daily.   naloxone  4 MG/0.1ML Liqd nasal spray kit Commonly known as: NARCAN  Place 1 spray into the nose once.   olmesartan 20 MG tablet Commonly known as: BENICAR Take 20 mg by mouth daily.   oxyCODONE  5 MG immediate release tablet Commonly known as: Oxy IR/ROXICODONE  Take 1-2 tablets (5-10 mg total) by mouth every 6 (six) hours as needed for moderate pain (pain score 4-6).   tiZANidine 4 MG capsule Commonly known as: ZANAFLEX Take 4 mg by mouth 3 (three) times daily as needed for muscle spasms.   valACYclovir 500 MG tablet Commonly known as: VALTREX Take 500 mg by mouth 2 (two) times daily as needed.   Voltaren 1 % Gel Generic drug: diclofenac Sodium Apply 4 g topically 4 (four) times daily as needed.        Follow-up Information     Renea Carrion, MD. Go on 10/17/2023.   Specialty: Obstetrics and Gynecology Why: for post op appointment on 10/17/23 at 8:30am Contact information: [address] [place] [address] [phone]                 Signed: Madelene Schanz 08/26/2023, 4:16 PM

## 2023-08-26 NOTE — Plan of Care (Signed)

## 2023-08-29 LAB — SURGICAL PATHOLOGY
